# Patient Record
Sex: Female | Born: 1965
Health system: Southern US, Community
[De-identification: ages and names within clinical notes are randomized; demographics above are authoritative.]

## PROBLEM LIST (undated history)

## (undated) DIAGNOSIS — I10 Essential (primary) hypertension: Secondary | ICD-10-CM

## (undated) DIAGNOSIS — F329 Major depressive disorder, single episode, unspecified: Secondary | ICD-10-CM

## (undated) DIAGNOSIS — G8929 Other chronic pain: Secondary | ICD-10-CM

## (undated) DIAGNOSIS — M1710 Unilateral primary osteoarthritis, unspecified knee: Secondary | ICD-10-CM

## (undated) DIAGNOSIS — J302 Other seasonal allergic rhinitis: Secondary | ICD-10-CM

## (undated) DIAGNOSIS — K219 Gastro-esophageal reflux disease without esophagitis: Secondary | ICD-10-CM

## (undated) DIAGNOSIS — F32A Depression, unspecified: Secondary | ICD-10-CM

## (undated) DIAGNOSIS — Z8489 Family history of other specified conditions: Secondary | ICD-10-CM

## (undated) DIAGNOSIS — M545 Low back pain, unspecified: Secondary | ICD-10-CM

## (undated) DIAGNOSIS — M179 Osteoarthritis of knee, unspecified: Secondary | ICD-10-CM

## (undated) DIAGNOSIS — K449 Diaphragmatic hernia without obstruction or gangrene: Secondary | ICD-10-CM

## (undated) DIAGNOSIS — K5909 Other constipation: Secondary | ICD-10-CM

## (undated) DIAGNOSIS — F419 Anxiety disorder, unspecified: Secondary | ICD-10-CM

## (undated) HISTORY — PX: JOINT REPLACEMENT: SHX530

## (undated) HISTORY — PX: ESOPHAGOGASTRODUODENOSCOPY: SHX1529

## (undated) HISTORY — PX: COLONOSCOPY: SHX174

---

## 1987-11-30 HISTORY — PX: TUBAL LIGATION: SHX77

## 1994-11-29 HISTORY — PX: TOE SURGERY: SHX1073

## 2005-11-29 HISTORY — PX: LAPAROSCOPIC NISSEN FUNDOPLICATION: SHX1932

## 2006-01-06 ENCOUNTER — Ambulatory Visit: Payer: Self-pay | Admitting: Psychiatry

## 2011-02-11 ENCOUNTER — Ambulatory Visit: Payer: Self-pay | Admitting: Otolaryngology

## 2012-12-08 IMAGING — CT CT MAXILLOFACIAL WITHOUT CONTRAST
2 series · 13 of 40 positions shown, 16 images · non-contrast
Comparison: none

REASON FOR EXAM: chronic sinusitis cough
COMMENTS:

[Series 2: osteo (id) · axial · 0.39mm/px · z∈[+642,+768]mm · 10 of 98 slices shown, 13 images]
[im 7/98  brain]
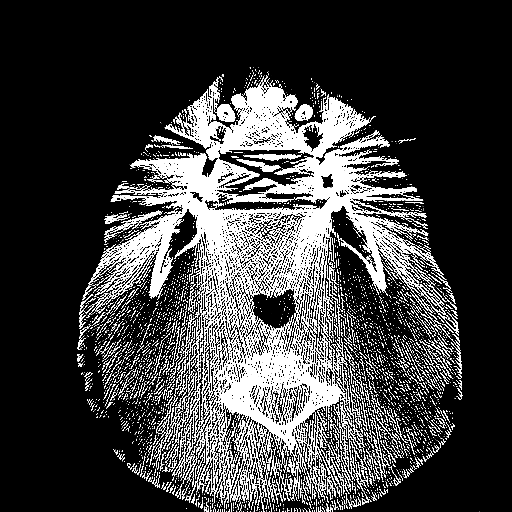
[im 7/98  bone]
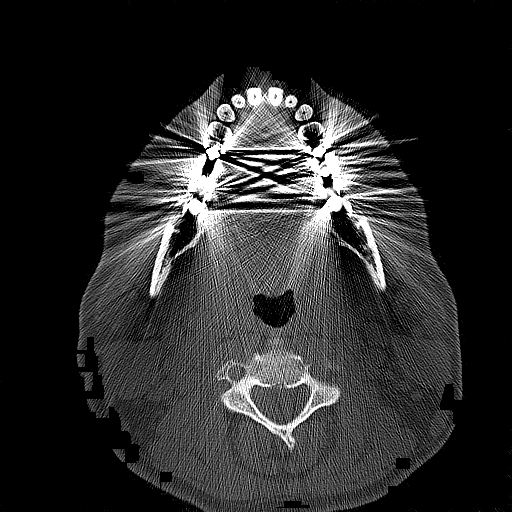
[im 17/98  bone]
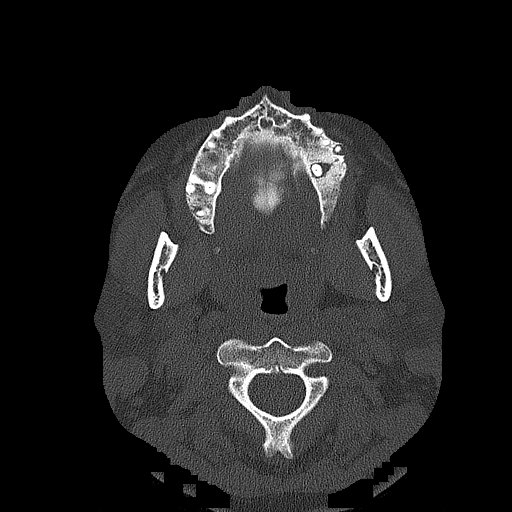
[im 27/98  bone]
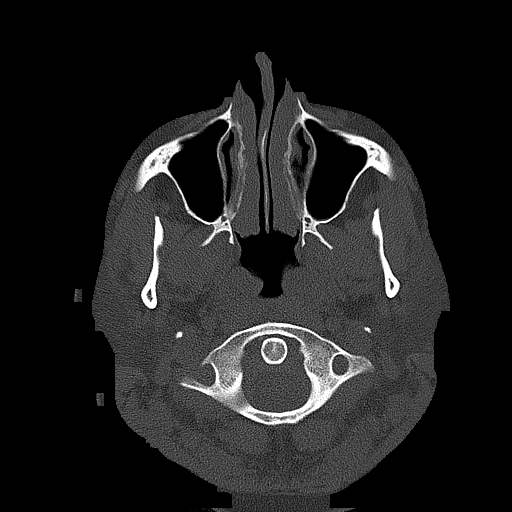
[im 34/98  bone]
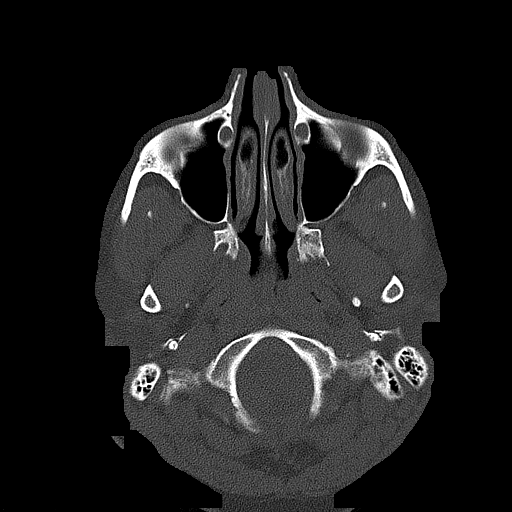
[im 44/98  brain]
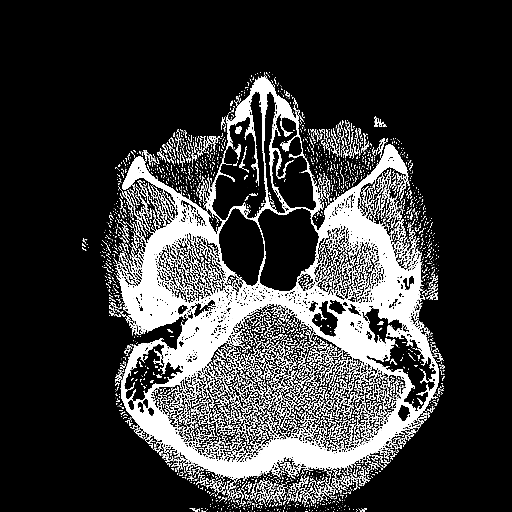
[im 44/98  bone]
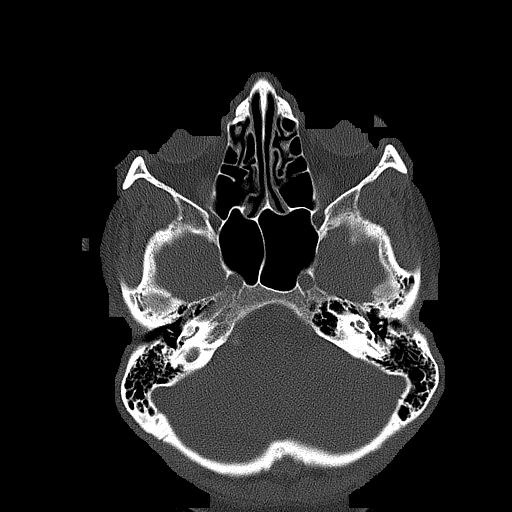
[im 54/98  bone]
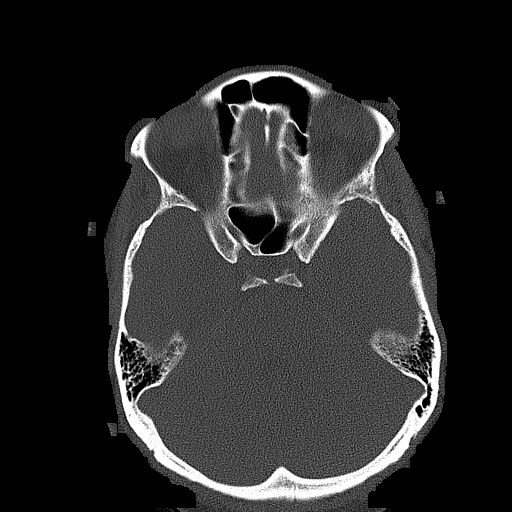
[im 64/98  bone]
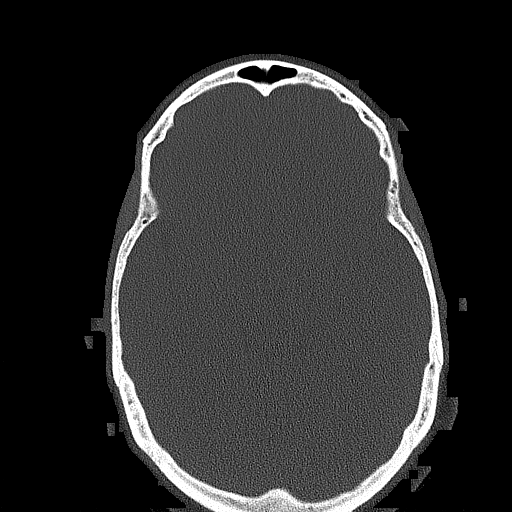
[im 74/98  bone]
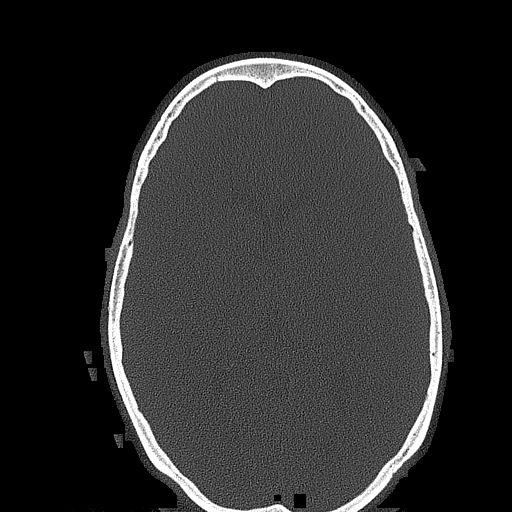
[im 81/98  brain]
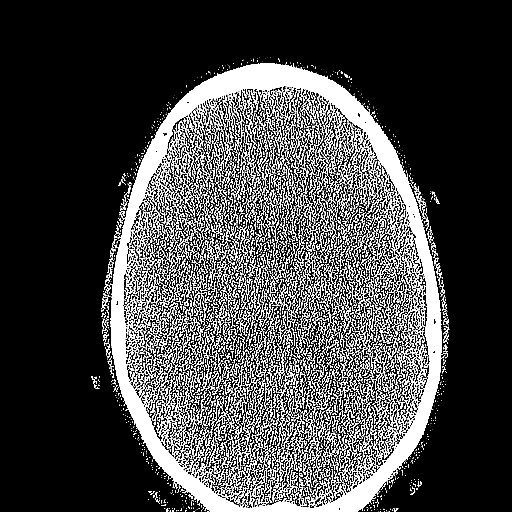
[im 81/98  bone]
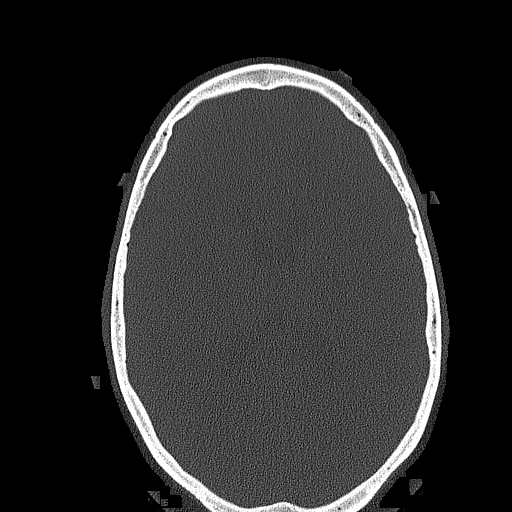
[im 91/98  bone]
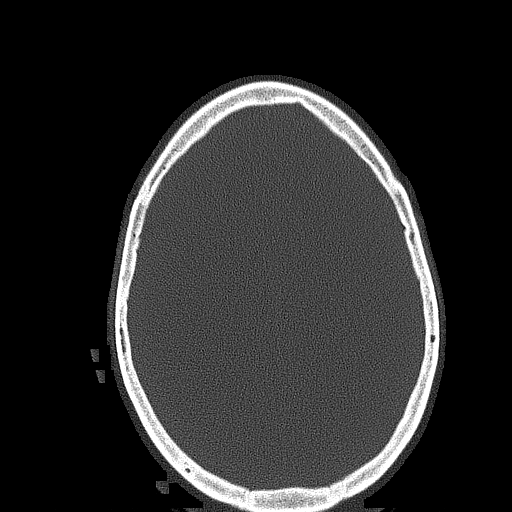

[Series 3: coronal · coronal · 0.29mm/px · 3 of 97 slices shown]
[im 33/97  bone]
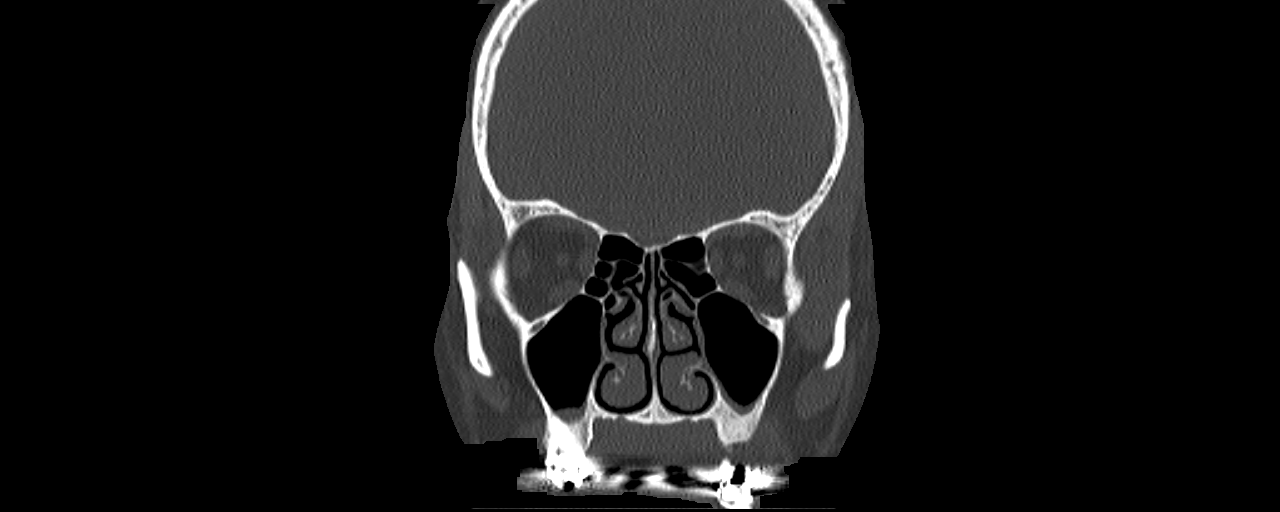
[im 43/97  bone]
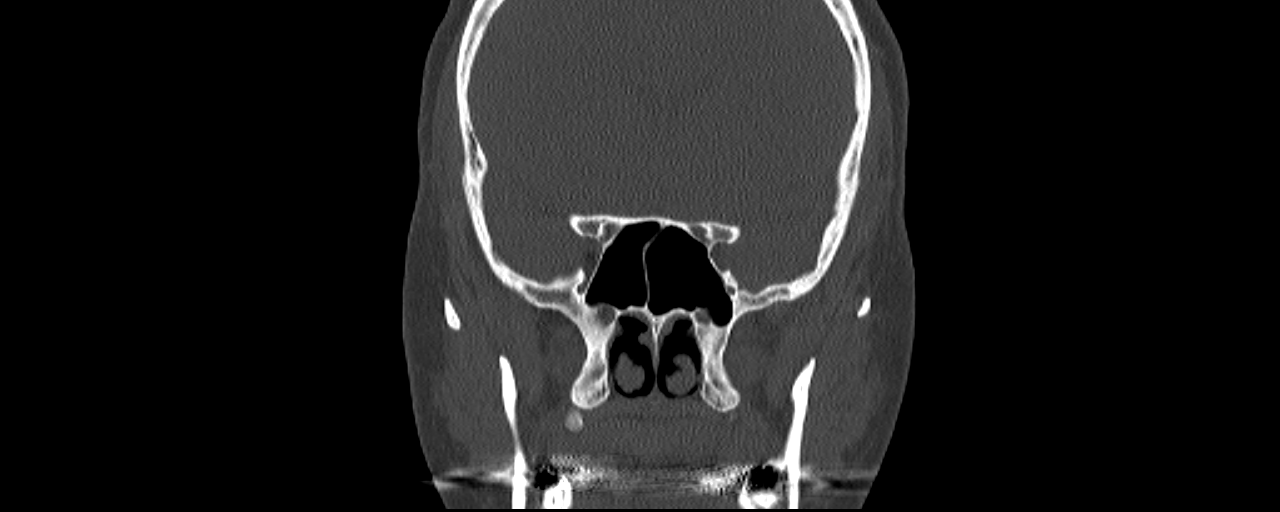
[im 54/97  bone]
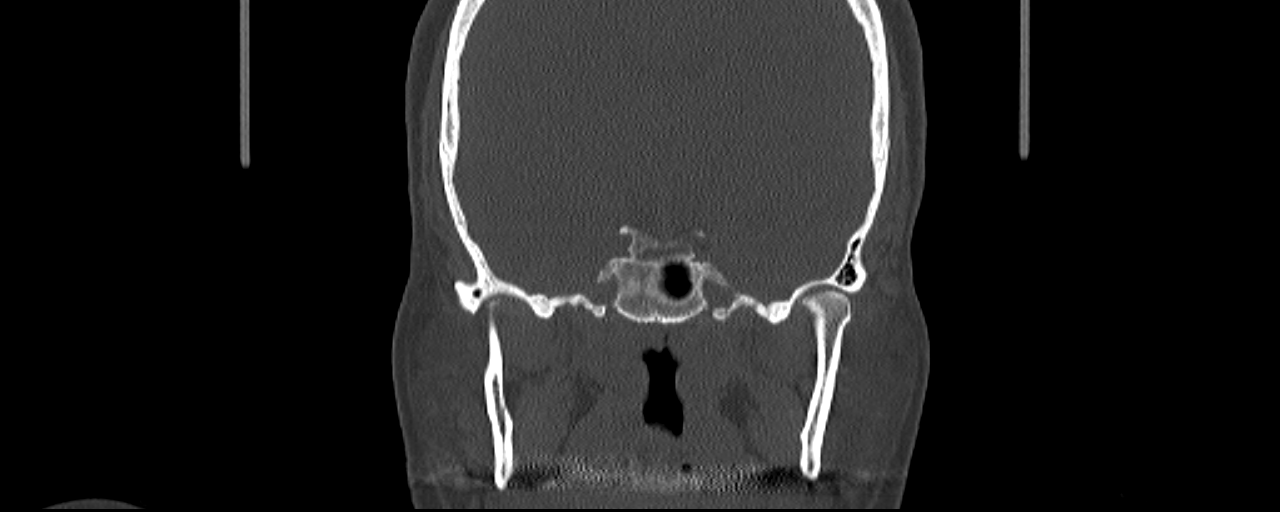

[13 of 40 positions shown; findings below may reference images not displayed]

PROCEDURE:     KCT - KCT MAXILLOFACIAL(CAVALLO)  - February 11, 2011  [DATE]

RESULT:     Maxillofacial CT is reconstructed at bone window settings in the
axial and coronal planes at 2 mm slice thickness. No previous examination is
available for comparison.

The nasal septum approximates midline. The ostiomeatal complexes are
normally formed and patent bilaterally. The sinuses appear clear without
evidence of significant mucosal thickening, mass or air-fluid level. Minimal
maxillary mucosal thickening is present. The mastoids demonstrate normal
aeration. The included portions of the calvarium and orbits appear to be
intact.
IMPRESSION: 1. No acute sinus disease.
2. Mucosal thickening in the maxillary sinuses.

## 2013-03-15 ENCOUNTER — Ambulatory Visit: Payer: Self-pay | Admitting: Gastroenterology

## 2013-04-05 ENCOUNTER — Ambulatory Visit: Payer: Self-pay | Admitting: Gastroenterology

## 2013-05-10 ENCOUNTER — Ambulatory Visit: Payer: Self-pay | Admitting: Gastroenterology

## 2013-06-04 ENCOUNTER — Other Ambulatory Visit: Payer: Self-pay | Admitting: Obstetrics and Gynecology

## 2013-06-12 ENCOUNTER — Encounter (HOSPITAL_COMMUNITY): Payer: Self-pay | Admitting: *Deleted

## 2013-06-14 NOTE — H&P (Signed)
Amanda Taylor, Amanda Taylor                ACCOUNT NO.:  [id number]  MEDICAL RECORD NO.:  [id number]  LOCATION:  [facility]                         FACILITY:  [facility]  PHYSICIAN:  Lenoard Aden, M.D.DATE OF BIRTH:  07-10-1966  DATE OF ADMISSION:  05/31/2013 DATE OF DISCHARGE:                             HISTORY & PHYSICAL   CHIEF COMPLAINT:  Menometrorrhagia.  HISTORY OF PRESENT ILLNESS:  She is a 47 year old African American female, G2, P2, history of tubal ligation, with refractory menorrhagia for definitive therapy.  MEDICAL PROBLEMS:  Include hypertension.  MEDICATIONS:  Dexilant, Zyrtec, losartan, and Mobic.  ALLERGIES:  Topamax and Elavil.  FAMILY HISTORY:  Diabetes, heart disease, and hypertension, history of vaginal delivery x2.  PHYSICAL EXAMINATION:  GENERAL:  She is a well-developed, well- nourished, African American female, in no acute distress. HEENT:  Normal. NECK:  Supple.  Full range of motion. LUNGS:  Clear. HEART:  Regular rhythm. ABDOMEN:  Soft, nontender. PELVIC:  Uterus to be bulky irregularly shaped, anteflexed with no adnexal masses. EXTREMITIES:  No cords. NEUROLOGIC:  Nonfocal. SKIN:  Intact.  IMPRESSION: 1. Refractory menometrorrhagia. 2. Hypertension.  PLAN:  To proceed with diagnostic hysteroscopy, D and C, NovaSure endometrial ablation.  Risks of anesthesia, infection, bleeding, injury to surrounding organs with possible need for repair is discussed.  The patient acknowledges and wishes to proceed.     Lenoard Aden, M.D.     RJT/MEDQ  D:  06/14/2013  T:  06/14/2013  Job:  [id number]

## 2013-06-15 ENCOUNTER — Encounter (HOSPITAL_COMMUNITY)
Admission: RE | Disposition: A | Discharge status: Home / Self Care | Payer: Self-pay | Source: Ambulatory Visit | Attending: Obstetrics and Gynecology

## 2013-06-15 ENCOUNTER — Ambulatory Visit (HOSPITAL_COMMUNITY): Payer: 59 | Admitting: Anesthesiology

## 2013-06-15 ENCOUNTER — Encounter (HOSPITAL_COMMUNITY): Payer: Self-pay | Admitting: *Deleted

## 2013-06-15 ENCOUNTER — Encounter (HOSPITAL_COMMUNITY): Payer: Self-pay | Admitting: Pharmacy Technician

## 2013-06-15 ENCOUNTER — Encounter (HOSPITAL_COMMUNITY): Payer: Self-pay | Admitting: Anesthesiology

## 2013-06-15 ENCOUNTER — Ambulatory Visit (HOSPITAL_COMMUNITY)
Admission: RE | Admit: 2013-06-15 | Discharge: 2013-06-15 | Disposition: A | Discharge status: Home / Self Care | Payer: 59 | Source: Ambulatory Visit | Attending: Obstetrics and Gynecology | Admitting: Obstetrics and Gynecology

## 2013-06-15 DIAGNOSIS — N840 Polyp of corpus uteri: Secondary | ICD-10-CM | POA: Insufficient documentation

## 2013-06-15 DIAGNOSIS — I10 Essential (primary) hypertension: Secondary | ICD-10-CM | POA: Insufficient documentation

## 2013-06-15 DIAGNOSIS — Z9851 Tubal ligation status: Secondary | ICD-10-CM | POA: Insufficient documentation

## 2013-06-15 DIAGNOSIS — N920 Excessive and frequent menstruation with regular cycle: Secondary | ICD-10-CM | POA: Insufficient documentation

## 2013-06-15 HISTORY — PX: DILITATION & CURRETTAGE/HYSTROSCOPY WITH NOVASURE ABLATION: SHX5568

## 2013-06-15 HISTORY — DX: Gastro-esophageal reflux disease without esophagitis: K21.9

## 2013-06-15 HISTORY — DX: Low back pain: M54.5

## 2013-06-15 HISTORY — DX: Essential (primary) hypertension: I10

## 2013-06-15 HISTORY — DX: Other seasonal allergic rhinitis: J30.2

## 2013-06-15 HISTORY — DX: Other chronic pain: G89.29

## 2013-06-15 HISTORY — DX: Low back pain, unspecified: M54.50

## 2013-06-15 LAB — CBC
Hemoglobin: 11.4 g/dL — ABNORMAL LOW (ref 12.0–15.0)
MCH: 26.8 pg (ref 26.0–34.0)
Platelets: 404 10*3/uL — ABNORMAL HIGH (ref 150–400)
RBC: 4.26 MIL/uL (ref 3.87–5.11)
WBC: 12 10*3/uL — ABNORMAL HIGH (ref 4.0–10.5)

## 2013-06-15 LAB — BASIC METABOLIC PANEL
CO2: 28 mEq/L (ref 19–32)
Chloride: 100 mEq/L (ref 96–112)
Creatinine, Ser: 1.27 mg/dL — ABNORMAL HIGH (ref 0.50–1.10)
GFR calc Af Amer: 57 mL/min — ABNORMAL LOW (ref >90)
Sodium: 136 mEq/L (ref 135–145)

## 2013-06-15 LAB — HCG, SERUM, QUALITATIVE: Preg, Serum: NEGATIVE

## 2013-06-15 SURGERY — DILATATION & CURETTAGE/HYSTEROSCOPY WITH NOVASURE ABLATION
Anesthesia: General | Site: Cervix | Wound class: Clean Contaminated

## 2013-06-15 MED ORDER — FENTANYL CITRATE 0.05 MG/ML IJ SOLN
INTRAMUSCULAR | Status: AC
  Administered 2013-06-15: 50 ug via INTRAVENOUS
  Filled 2013-06-15: qty 2

## 2013-06-15 MED ORDER — KETOROLAC TROMETHAMINE 30 MG/ML IJ SOLN
INTRAMUSCULAR | Status: DC | PRN
  Administered 2013-06-15: 30 mg via INTRAVENOUS

## 2013-06-15 MED ORDER — FENTANYL CITRATE 0.05 MG/ML IJ SOLN: 25.0000 ug | INTRAMUSCULAR | Status: DC | PRN

## 2013-06-15 MED ORDER — KETOROLAC TROMETHAMINE 30 MG/ML IJ SOLN: 15.0000 mg | Freq: Once | INTRAMUSCULAR | Status: DC | PRN

## 2013-06-15 MED ORDER — MEPERIDINE HCL 25 MG/ML IJ SOLN: 6.2500 mg | INTRAMUSCULAR | Status: DC | PRN

## 2013-06-15 MED ORDER — DEXAMETHASONE SODIUM PHOSPHATE 10 MG/ML IJ SOLN
INTRAMUSCULAR | Status: DC | PRN
  Administered 2013-06-15: 10 mg via INTRAVENOUS

## 2013-06-15 MED ORDER — BUPIVACAINE HCL (PF) 0.25 % IJ SOLN
INTRAMUSCULAR | Status: DC | PRN
  Administered 2013-06-15 (×2): 10 mL

## 2013-06-15 MED ORDER — OXYCODONE-ACETAMINOPHEN 5-325 MG PO TABS: 1.0000 | ORAL_TABLET | Freq: Once | ORAL | Status: AC

## 2013-06-15 MED ORDER — KETOROLAC TROMETHAMINE 30 MG/ML IJ SOLN
INTRAMUSCULAR | Status: AC
  Filled 2013-06-15: qty 1

## 2013-06-15 MED ORDER — ONDANSETRON HCL 4 MG/2ML IJ SOLN
INTRAMUSCULAR | Status: DC | PRN
  Administered 2013-06-15: 4 mg via INTRAVENOUS

## 2013-06-15 MED ORDER — BUPIVACAINE HCL (PF) 0.25 % IJ SOLN
INTRAMUSCULAR | Status: AC
  Filled 2013-06-15: qty 30

## 2013-06-15 MED ORDER — OXYCODONE-ACETAMINOPHEN 5-325 MG PO TABS
ORAL_TABLET | ORAL | Status: AC
  Administered 2013-06-15: 1 via ORAL
  Filled 2013-06-15: qty 1

## 2013-06-15 MED ORDER — ONDANSETRON HCL 4 MG/2ML IJ SOLN: 4.0000 mg | Freq: Once | INTRAMUSCULAR | Status: DC | PRN

## 2013-06-15 MED ORDER — OXYCODONE-ACETAMINOPHEN 5-325 MG PO TABS: 1.0000 | ORAL_TABLET | ORAL | Status: DC | PRN

## 2013-06-15 MED ORDER — METOCLOPRAMIDE HCL 5 MG/ML IJ SOLN
INTRAMUSCULAR | Status: AC
  Administered 2013-06-15: 10 mg via INTRAVENOUS
  Filled 2013-06-15: qty 2

## 2013-06-15 MED ORDER — MIDAZOLAM HCL 5 MG/5ML IJ SOLN
INTRAMUSCULAR | Status: DC | PRN
  Administered 2013-06-15: 2 mg via INTRAVENOUS

## 2013-06-15 MED ORDER — FENTANYL CITRATE 0.05 MG/ML IJ SOLN
INTRAMUSCULAR | Status: AC
  Filled 2013-06-15: qty 2

## 2013-06-15 MED ORDER — METOCLOPRAMIDE HCL 5 MG/ML IJ SOLN: 10.0000 mg | Freq: Once | INTRAMUSCULAR | Status: AC

## 2013-06-15 MED ORDER — MIDAZOLAM HCL 2 MG/2ML IJ SOLN
INTRAMUSCULAR | Status: AC
  Filled 2013-06-15: qty 2

## 2013-06-15 MED ORDER — DEXAMETHASONE SODIUM PHOSPHATE 10 MG/ML IJ SOLN
INTRAMUSCULAR | Status: AC
  Filled 2013-06-15: qty 1

## 2013-06-15 MED ORDER — LACTATED RINGERS IV SOLN
INTRAVENOUS | Status: DC
  Administered 2013-06-15: 50 mL/h via INTRAVENOUS

## 2013-06-15 MED ORDER — LIDOCAINE HCL (CARDIAC) 20 MG/ML IV SOLN
INTRAVENOUS | Status: DC | PRN
  Administered 2013-06-15: 100 mg via INTRAVENOUS

## 2013-06-15 MED ORDER — FENTANYL CITRATE 0.05 MG/ML IJ SOLN
INTRAMUSCULAR | Status: DC | PRN
  Administered 2013-06-15: 100 ug via INTRAVENOUS

## 2013-06-15 MED ORDER — LIDOCAINE HCL (CARDIAC) 20 MG/ML IV SOLN
INTRAVENOUS | Status: AC
  Filled 2013-06-15: qty 5

## 2013-06-15 MED ORDER — CEFAZOLIN SODIUM-DEXTROSE 2-3 GM-% IV SOLR: 2.0000 g | INTRAVENOUS | Status: DC

## 2013-06-15 MED ORDER — VASOPRESSIN 20 UNIT/ML IJ SOLN
INTRAMUSCULAR | Status: AC
  Filled 2013-06-15: qty 1

## 2013-06-15 MED ORDER — CEFAZOLIN SODIUM-DEXTROSE 2-3 GM-% IV SOLR
INTRAVENOUS | Status: AC
  Administered 2013-06-15: 2 g via INTRAVENOUS
  Filled 2013-06-15: qty 50

## 2013-06-15 MED ORDER — PROPOFOL 10 MG/ML IV EMUL
INTRAVENOUS | Status: AC
  Filled 2013-06-15: qty 20

## 2013-06-15 MED ORDER — LACTATED RINGERS IV SOLN
INTRAVENOUS | Status: DC | PRN
  Administered 2013-06-15: 10:00:00 via INTRAVENOUS

## 2013-06-15 MED ORDER — ONDANSETRON HCL 4 MG/2ML IJ SOLN
INTRAMUSCULAR | Status: AC
  Filled 2013-06-15: qty 2

## 2013-06-15 MED ORDER — PROPOFOL 10 MG/ML IV BOLUS
INTRAVENOUS | Status: DC | PRN
  Administered 2013-06-15: 200 mg via INTRAVENOUS

## 2013-06-15 SURGICAL SUPPLY — 13 items
ABLATOR ENDOMETRIAL BIPOLAR (ABLATOR) ×2 IMPLANT
CATH ROBINSON RED A/P 16FR (CATHETERS) ×2 IMPLANT
CLOTH BEACON ORANGE TIMEOUT ST (SAFETY) ×2 IMPLANT
CONTAINER PREFILL 10% NBF 60ML (FORM) ×4 IMPLANT
DRESSING TELFA 8X3 (GAUZE/BANDAGES/DRESSINGS) ×2 IMPLANT
GLOVE BIO SURGEON STRL SZ7.5 (GLOVE) ×2 IMPLANT
GOWN STRL REIN XL XLG (GOWN DISPOSABLE) ×4 IMPLANT
PACK HYSTEROSCOPY LF (CUSTOM PROCEDURE TRAY) ×2 IMPLANT
PAD OB MATERNITY 4.3X12.25 (PERSONAL CARE ITEMS) ×2 IMPLANT
PAD PREP 24X48 CUFFED NSTRL (MISCELLANEOUS) ×2 IMPLANT
SYR TB 1ML 25GX5/8 (SYRINGE) ×2 IMPLANT
TOWEL OR 17X24 6PK STRL BLUE (TOWEL DISPOSABLE) ×4 IMPLANT
WATER STERILE IRR 1000ML POUR (IV SOLUTION) ×2 IMPLANT

## 2013-06-15 NOTE — Anesthesia Procedure Notes (Signed)
Procedure Name: LMA Insertion Date/Time: 06/15/2013 9:54 AM Performed by: Masiah Woody, Jannet Askew Pre-anesthesia Checklist: Patient identified, Patient being monitored, Emergency Drugs available, Timeout performed and Suction available Patient Re-evaluated:Patient Re-evaluated prior to inductionOxygen Delivery Method: Circle system utilized Preoxygenation: Pre-oxygenation with 100% oxygen Intubation Type: IV induction Ventilation: Mask ventilation without difficulty LMA: LMA inserted LMA Size: 4.0 Number of attempts: 1 ETT to lip (cm): at lips. Dental Injury: Teeth and Oropharynx as per pre-operative assessment

## 2013-06-15 NOTE — Anesthesia Postprocedure Evaluation (Signed)
Anesthesia Post Note  Patient: Amanda Taylor  Procedure(s) Performed: Procedure(s) (LRB): DILATATION & CURETTAGE/HYSTEROSCOPY WITH NOVASURE ABLATION (N/A)  Anesthesia type: General  Patient location: PACU  Post pain: Pain level controlled  Post assessment: Post-op Vital signs reviewed  Last Vitals:  Filed Vitals:   06/15/13 1130  BP:   Pulse:   Temp: 36.5 C  Resp: 18    Post vital signs: Reviewed  Level of consciousness: sedated  Complications: No apparent anesthesia complications

## 2013-06-15 NOTE — Op Note (Signed)
06/15/2013  10:18 AM  PATIENT:  Etta Grandchild  47 y.o. female  PRE-OPERATIVE DIAGNOSIS:  Dysfunctional Uterine Bleeding   Endometrial polyp  POST-OPERATIVE DIAGNOSIS:  Dysfunctional Uterine Bleeding    PROCEDURE:  Procedure(s): DILATATION & CURETTAGE/HYSTEROSCOPY WITH NOVASURE ABLATION Removal of endometrial polyp  SURGEON:  Surgeon(s): Lenoard Aden, MD  ASSISTANTS: none   ANESTHESIA:   local and general  ESTIMATED BLOOD LOSS: * No blood loss amount entered *   DRAINS: none   LOCAL MEDICATIONS USED:  MARCAINE     SPECIMEN:  Source of Specimen:  EMC with polyp  DISPOSITION OF SPECIMEN:  PATHOLOGY  COUNTS:  YES  DICTATION #: [id number]  PLAN OF CARE: dc home  PATIENT DISPOSITION:  PACU - hemodynamically stable.

## 2013-06-15 NOTE — Anesthesia Preprocedure Evaluation (Signed)
Anesthesia Evaluation  Patient identified by MRN, date of birth, ID band Patient awake    Reviewed: Allergy & Precautions, H&P , NPO status , Patient's Chart, lab work & pertinent test results  Airway Mallampati: I TM Distance: >3 FB Neck ROM: full    Dental no notable dental hx. (+) Teeth Intact   Pulmonary neg pulmonary ROS,    Pulmonary exam normal       Cardiovascular hypertension, Pt. on medications     Neuro/Psych negative neurological ROS  negative psych ROS   GI/Hepatic Neg liver ROS, GERD-  ,  Endo/Other  Morbid obesity  Renal/GU negative Renal ROS  negative genitourinary   Musculoskeletal negative musculoskeletal ROS (+)   Abdominal (+) + obese,   Peds negative pediatric ROS (+)  Hematology negative hematology ROS (+)   Anesthesia Other Findings   Reproductive/Obstetrics negative OB ROS                           Anesthesia Physical Anesthesia Plan  ASA: III  Anesthesia Plan: General   Post-op Pain Management:    Induction: Intravenous  Airway Management Planned: LMA  Additional Equipment:   Intra-op Plan:   Post-operative Plan:   Informed Consent: I have reviewed the patients History and Physical, chart, labs and discussed the procedure including the risks, benefits and alternatives for the proposed anesthesia with the patient or authorized representative who has indicated his/her understanding and acceptance.     Plan Discussed with: CRNA and Surgeon  Anesthesia Plan Comments:         Anesthesia Quick Evaluation

## 2013-06-15 NOTE — Progress Notes (Signed)
Patient ID: Amanda Taylor, female   DOB: 09/14/1966, 47 y.o.   MRN: [id number] Patient seen and examined. Consent witnessed and signed. No changes noted. Update completed.

## 2013-06-15 NOTE — Op Note (Signed)
Amanda Taylor, Amanda Taylor                ACCOUNT NO.:  [id number]  MEDICAL RECORD NO.:  [id number]  LOCATION:  [facility]                          FACILITY:  [facility]  PHYSICIAN:  Lenoard Aden, M.D.DATE OF BIRTH:  [birth year]  DATE OF PROCEDURE:  06/15/2013 DATE OF DISCHARGE:  06/15/2013                              OPERATIVE REPORT   DESCRIPTION OF PROCEDURE:  After being apprised of risks of anesthesia, infection, bleeding, injury to surrounding organs, possible need for repair, delayed versus immediate complications include bowel and bladder injury, possible need for repair.  The patient was brought to the operating room where she was administered general anesthetic without complications.  Prepped and draped in usual sterile fashion.  Feet were placed in Yellofin stirrups.  Exam under anesthesia reveals a bulky irregular anteflexed uterus and no adnexal masses appreciated.  Speculum placed, dilute Marcaine solution placed 20 mL total standard paracervical block.  After placement of the paracervical block, the cervix was easily dilated up to 25 Pratt dilator.  Hysteroscope was placed.  Visualization reveals a small posterior lateral endometrial polyp which was resected using endometrial polyp forceps without difficulty and sent with endometrial curettage specimen which was collected using sharp curettage in a four-quadrant method.  Bilateral normal tubal ostia noted.  At this time, the NovaSure device was entered seated to a cavity length of 6.5, a cavity width of 3.8.  CO2 test was performed and was negative.  The procedure was initiated for 42 seconds to a power of 136 watts without complications.  Device was removed, inspected, and found to be intact.  Revisualization of endometrial cavity reveals well ablated endometrial cavity with no evidence of intracavitary defect.  No evidence of perforation fluid deficit 55 mL. Blood loss minimal.  The patient tolerated the procedure well,  was awakened, and transferred to recovery in good condition.     Lenoard Aden, M.D.     RJT/MEDQ  D:  06/15/2013  T:  06/15/2013  Job:  [id number]

## 2013-06-15 NOTE — Transfer of Care (Signed)
Immediate Anesthesia Transfer of Care Note  Patient: Amanda Taylor  Procedure(s) Performed: Procedure(s): DILATATION & CURETTAGE/HYSTEROSCOPY WITH NOVASURE ABLATION (N/A)  Patient Location: PACU  Anesthesia Type:General  Level of Consciousness: awake, alert  and oriented  Airway & Oxygen Therapy: Patient Spontanous Breathing and Patient connected to nasal cannula oxygen  Post-op Assessment: Report given to PACU RN and Post -op Vital signs reviewed and stable  Post vital signs: Reviewed and stable  Complications: No apparent anesthesia complications

## 2013-06-18 ENCOUNTER — Encounter (HOSPITAL_COMMUNITY): Payer: Self-pay | Admitting: Obstetrics and Gynecology

## 2015-01-10 IMAGING — US ABDOMEN ULTRASOUND LIMITED
1 series · 14 of 25 positions shown · non-contrast
Comparison: none

REASON FOR EXAM: Abdominal pain epigastric
COMMENTS:

[Series 1: abdomen ultrasound limited · 0.33mm/px · 14 of 106 slices shown]
[im 1/106]
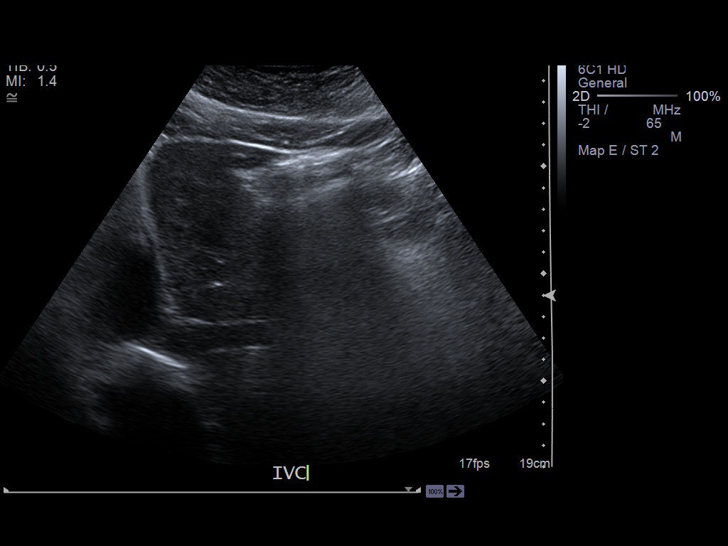
[im 9/106]
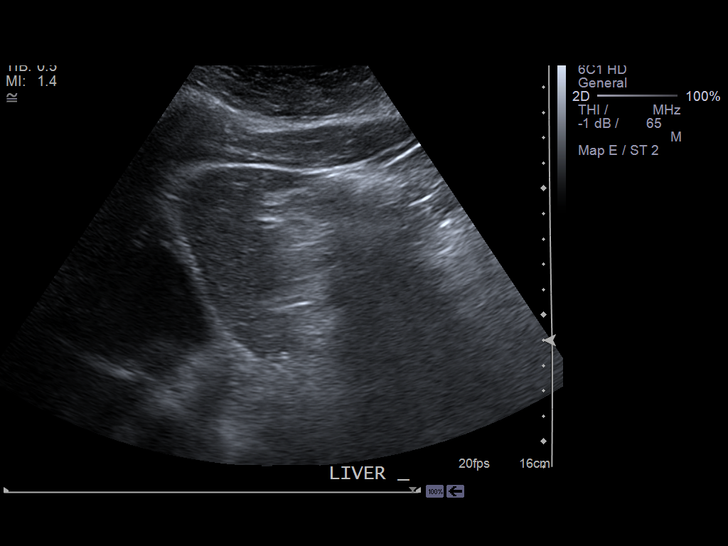
[im 18/106]
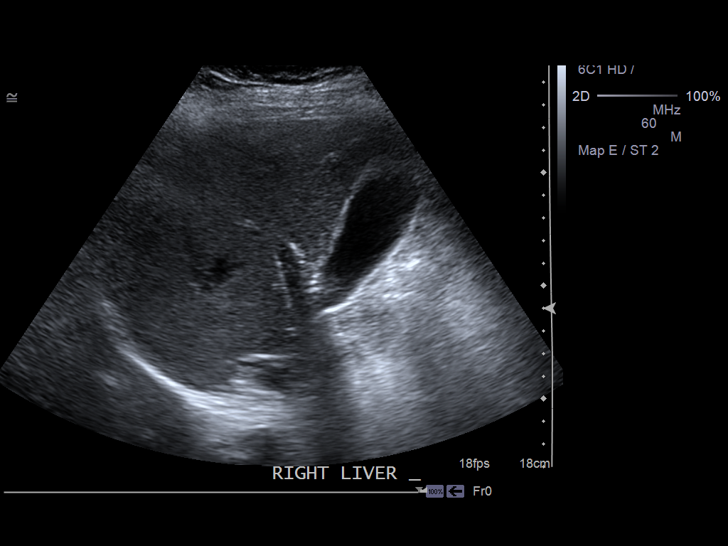
[im 27/106]
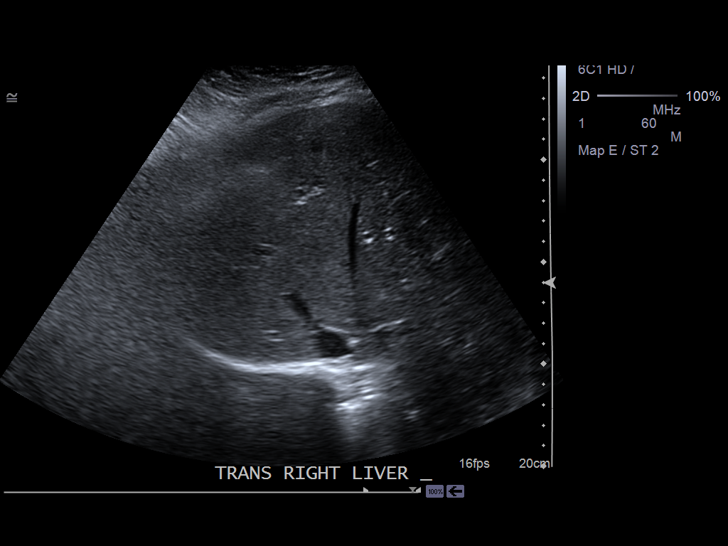
[im 36/106]
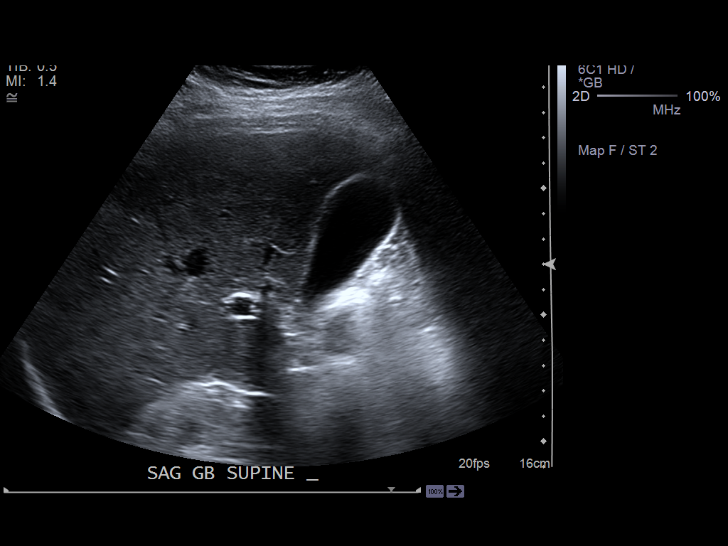
[im 40/106]
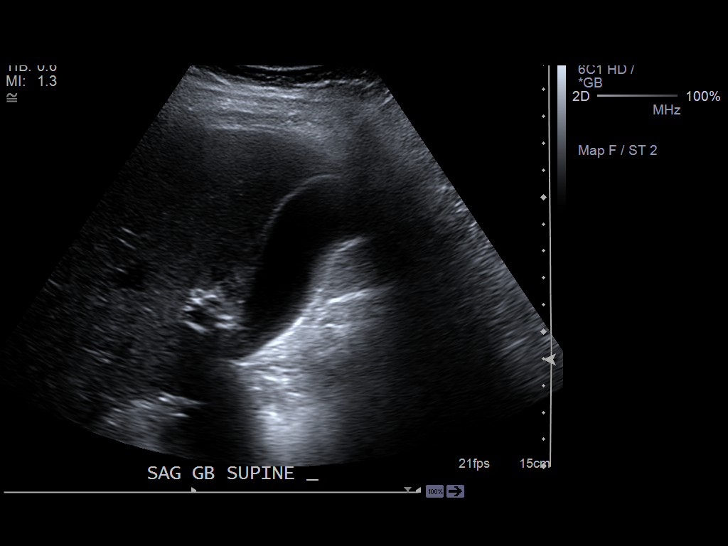
[im 49/106]
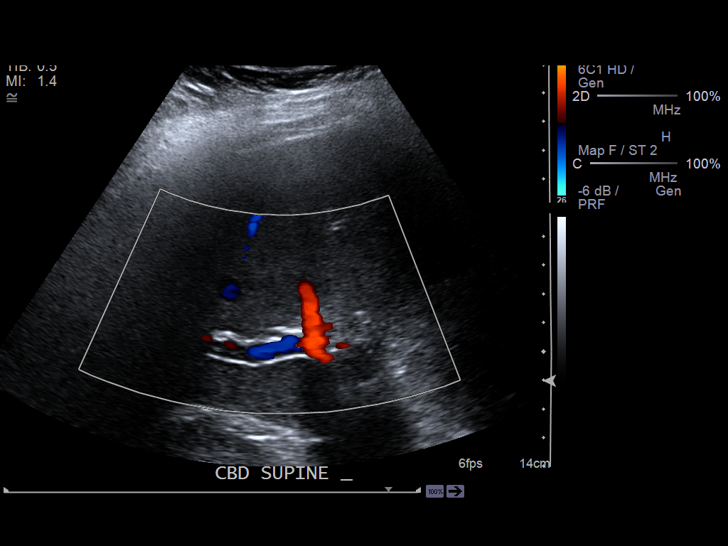
[im 57/106]
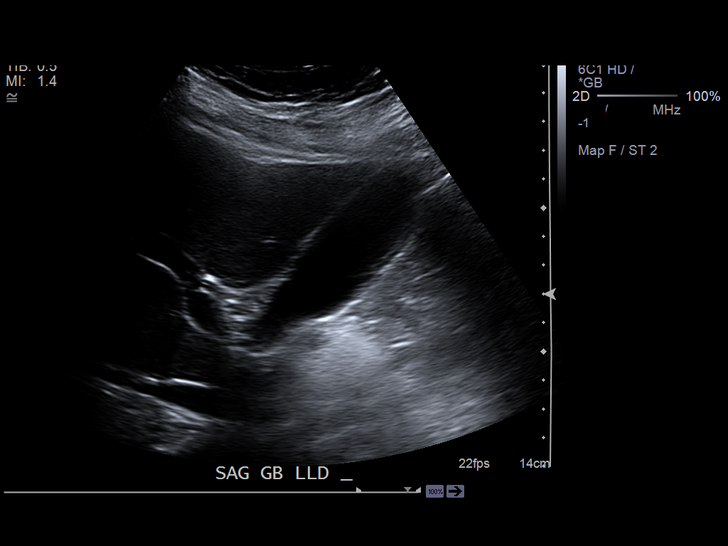
[im 66/106]
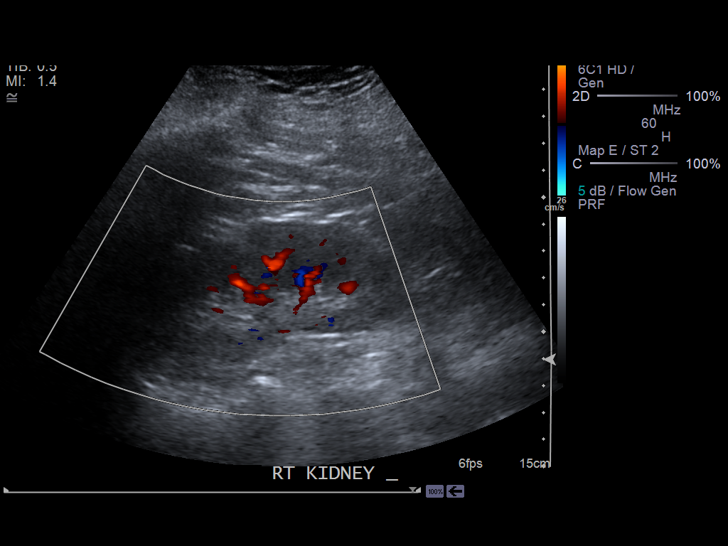
[im 71/106]
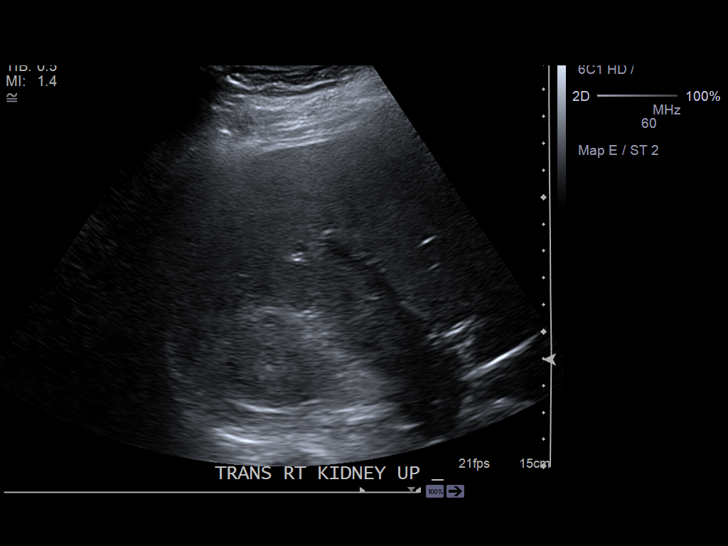
[im 79/106]
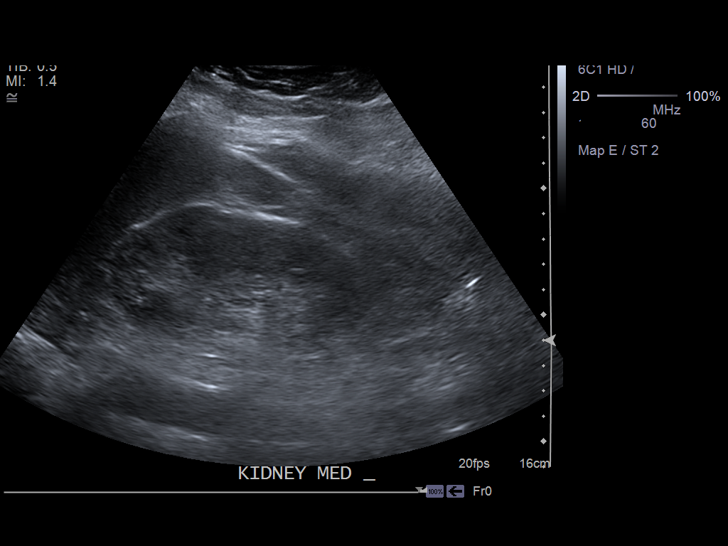
[im 88/106]
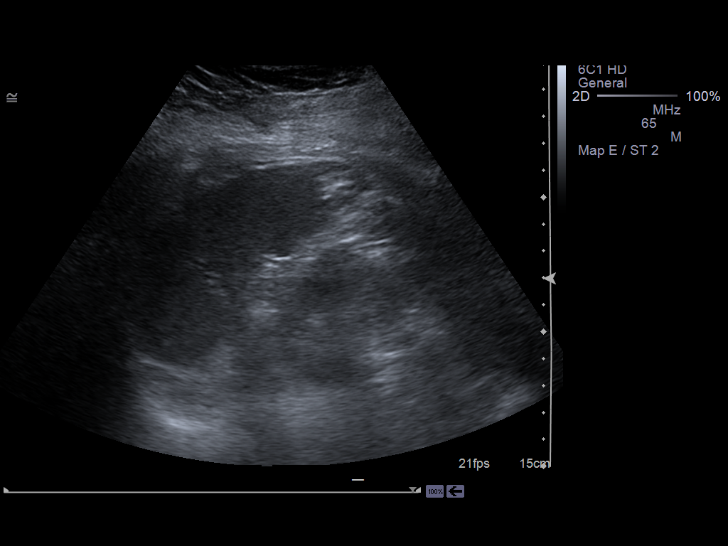
[im 97/106]
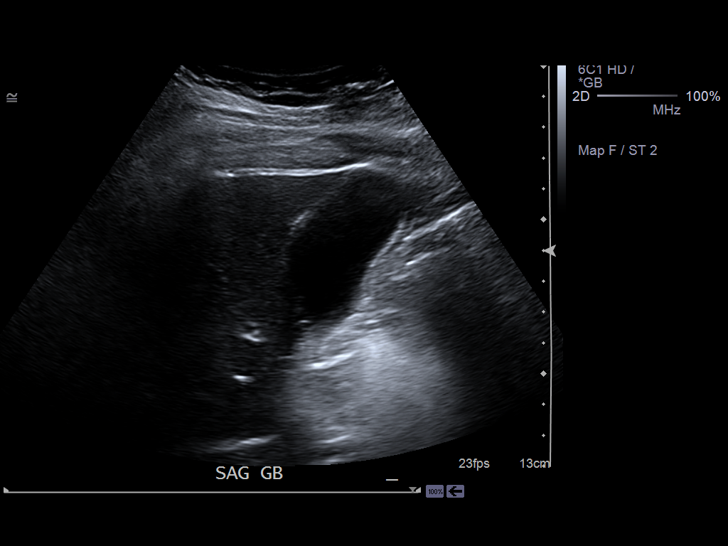
[im 106/106]
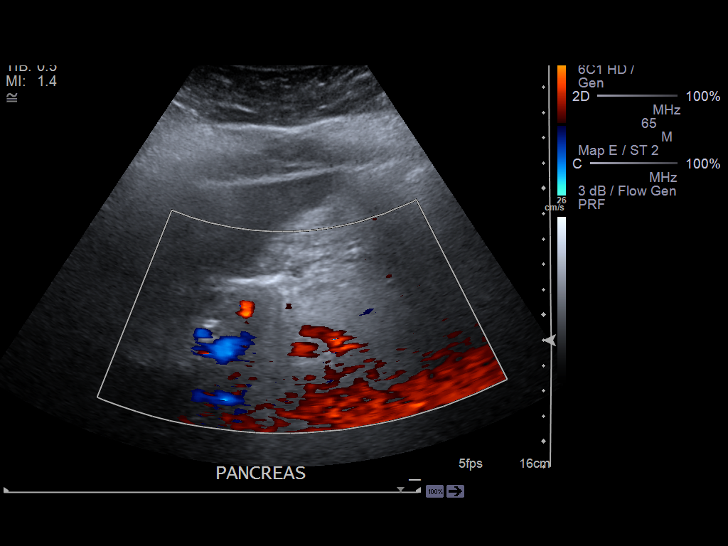

[14 of 25 positions shown; findings below may reference images not displayed]

PROCEDURE:     BOO - BOO ABDOMEN UPPER GENERAL  - March 15, 2013  [DATE]

RESULT:     Abdominal sonogram shows the pancreas is predominantly skewered
by overlying bowel gas as is the proximal abdominal aorta. The mid to distal
aorta is visualized and appears unremarkable. The proximal inferior vena
cava appears within normal limits. The liver length is 17.6 cm in the mid
axillary line area portal venous flow is normal. The hepatic echotexture is
normal. There are no gallstones. The gallbladder wall thickness is 2.0 mm.
There is a negative sonographic Murphy's sign. The portal venous flow is
normal. Common bile duct diameter is 3.2 to 4.0 mm. The right kidney
measures 10.93 x 5.19 x 4.86 cm. The left kidney measures 10.28 x 4.69 x
4.77 cm. No stones or solid or cystic masses are seen. The spleen has a
length of 9.07 cm with a normal echotexture.
IMPRESSION: Limited visualization of the aorta, pancreas and inferior
vena cava. No acute abnormality evident.

[REDACTED]

## 2017-07-13 ENCOUNTER — Ambulatory Visit (INDEPENDENT_AMBULATORY_CARE_PROVIDER_SITE_OTHER): Payer: 59 | Admitting: Obstetrics and Gynecology

## 2017-07-13 ENCOUNTER — Encounter: Payer: Self-pay | Admitting: Obstetrics and Gynecology

## 2017-07-13 VITALS — BP 122/80 | HR 93 | Ht 65.0 in | Wt 276.0 lb

## 2017-07-13 DIAGNOSIS — Z131 Encounter for screening for diabetes mellitus: Secondary | ICD-10-CM | POA: Diagnosis not present

## 2017-07-13 DIAGNOSIS — E669 Obesity, unspecified: Secondary | ICD-10-CM

## 2017-07-13 DIAGNOSIS — Z6841 Body Mass Index (BMI) 40.0 and over, adult: Secondary | ICD-10-CM | POA: Diagnosis not present

## 2017-07-13 DIAGNOSIS — Z1329 Encounter for screening for other suspected endocrine disorder: Secondary | ICD-10-CM | POA: Diagnosis not present

## 2017-07-13 DIAGNOSIS — IMO0001 Reserved for inherently not codable concepts without codable children: Secondary | ICD-10-CM

## 2017-07-13 MED ORDER — PHENTERMINE HCL 37.5 MG PO TABS: 37.5000 mg | ORAL_TABLET | Freq: Every day | ORAL | 0 refills | Status: DC

## 2017-07-13 NOTE — Progress Notes (Signed)
Gynecology Office Visit  Chief Complaint:  Chief Complaint  Patient presents with  . Weight Loss    discuss weight loss    History of Present Illness: Patientis a [name], who presents for the evaluation of weight gain. She has gained had an inability to loose weight. The patient states the following issues have contributed to her weight problem: inactivity.  The patient has chronic lumbago. The patient specifically denies memory loss, muscle weakness, excessive thirst, and polyuria. Weight related co-morbidities include Hypertension.  She has tried phentermine in the past with moderate success.   No menstural issues s/p ablation  Review of Systems: 10 point review of systems negative unless otherwise noted in HPI  Past Medical History:  Past Medical History:  Diagnosis Date  . Chronic low back pain    mobic  . GERD (gastroesophageal reflux disease)    on dexiant  . Hypertension    on losartan-will take dos  . Seasonal allergies     Past Surgical History:  Past Surgical History:  Procedure Laterality Date  . COLONOSCOPY    . DILITATION & CURRETTAGE/HYSTROSCOPY WITH NOVASURE ABLATION N/A 06/15/2013   Procedure: DILATATION & CURETTAGE/HYSTEROSCOPY WITH NOVASURE ABLATION;  Surgeon: Lenoard Aden, MD;  Location: [facility];  Service: Gynecology;  Laterality: N/A;  . ESOPHAGOGASTRODUODENOSCOPY    . TOE SURGERY    . TUBAL LIGATION      Gynecologic History: No LMP recorded. Patient has had an ablation.  Obstetric History: G2P2  Family History:  History reviewed. No pertinent family history.  Social History:  Social History   Social History  . Marital status: Divorced    Spouse name: N/A  . Number of children: N/A  . Years of education: N/A   Occupational History  . Not on file.   Social History Main Topics  . Smoking status: Never Smoker  . Smokeless tobacco: Never Used  . Alcohol use Yes  . Drug use: No  . Sexual activity: Yes    Partners:  Male    Birth control/ protection: Surgical     Comment: Ablation   Other Topics Concern  . Not on file   Social History Narrative  . No narrative on file    Allergies:  Allergies  Allergen Reactions  . Elavil [Amitriptyline] Other (See Comments)    Urine retention  . Topamax [Topiramate] Other (See Comments)    Numbness in fingertips    Medications: Prior to Admission medications   Medication Sig Start Date End Date Taking? Authorizing Provider  ALPRAZolam Prudy Feeler) 1 MG tablet Take 1 mg by mouth 2 (two) times daily. 06/16/17  Yes [provider]  cyclobenzaprine (FLEXERIL) 10 MG tablet  06/10/17  Yes [provider]  esomeprazole (NEXIUM) 40 MG capsule  05/31/17  Yes [provider]  FLUoxetine (PROZAC) 40 MG capsule  05/31/17  Yes [provider]  hydrochlorothiazide (HYDRODIURIL) 25 MG tablet  05/31/17  Yes [provider]  losartan (COZAAR) 100 MG tablet  05/31/17  Yes [provider]  losartan-hydrochlorothiazide (HYZAAR) 100-25 MG per tablet Take 1 tablet by mouth daily.   Yes [provider]  meloxicam (MOBIC) 15 MG tablet Take 15 mg by mouth daily.   Yes [provider]  oxyCODONE-acetaminophen (PERCOCET) 10-325 MG tablet TAKE ONE TABLET BY MOUTH FOUR TIMES DAILY AS NEEDED FOR PAIN 06/16/17  Yes [provider]  promethazine (PHENERGAN) 25 MG tablet  06/16/17  Yes [provider]  ALPRAZolam (  XANAX) 0.25 MG tablet Take 1 mg by mouth as needed for sleep.    [provider]  cetirizine (ZYRTEC) 10 MG tablet Take 10 mg by mouth daily.    [provider]  Dexlansoprazole 30 MG capsule Take 30 mg by mouth at bedtime.    [provider]  oxyCODONE-acetaminophen (ROXICET) 5-325 MG per tablet Take 1-2 tablets by mouth every 4 (four) hours as needed for pain. Patient not taking: Reported on 07/13/2017 06/15/13   Olivia Mackieaavon, Richard, MD    Physical Exam Vitals:  Vitals:    07/13/17 1001  BP: 122/80  Pulse: 93   Filed Weights   07/13/17 1001  Weight: 276 lb (125.2 kg)   Body mass index is 45.93 kg/m.  No LMP recorded. Patient has had an ablation.  General: NAD HEENT: normocephalic, anicteric Thyroid: no enlargement Pulmonary: no increased work of breathing Neurologic: Grossly intact Psychiatric: mood appropriate, affect full  Assessment: 51 y.o. G2P2 presenting for discussion of weight loss management options  Plan: Problem List Items Addressed This Visit    None    Visit Diagnoses    Class 3 obesity without serious comorbidity with body mass index (BMI) of 40.0 to 44.9 in adult, unspecified obesity type (HCC)    -  Primary   Relevant Medications   phentermine (ADIPEX-P) 37.5 MG tablet   Other Relevant Orders   TSH   Hemoglobin A1c   Thyroid disorder screening       Relevant Orders   TSH   Screening for diabetes mellitus       Relevant Orders   Hemoglobin A1c      1) 1500 Calorie ADA Diet  2) Patient education given regarding appropriate lifestyle changes for weight loss including: regular physical activity, healthy coping strategies, caloric restriction and healthy eating patterns.  3) Patient will be started on weight loss medication. The risks and benefits and side effects of medication, such as Adipex (Phenteramine) ,  Tenuate (Diethylproprion), Belviq (lorcarsin), Contrave (buproprion/naltrexone), Qsymia (phentermine/topiramate), and Saxenda (liraglutide) is discussed. The pros and cons of suppressing appetite and boosting metabolism is discussed. Risks of tolerence and addiction is discussed for selected agents discussed. Use of medicine will ne short term, such as 3-4 months at a time followed by a period of time off of the medicine to avoid these risks and side effects for Adipex, Qsymia, and Tenuate discussed. Pt to call with any negative side effects and agrees to keep follow up appts. - has done well in past with no aggrevation  in blood pressure  4) Comorbidity Screening - hypothyroidism screening, diabetes, and hyperlipidemia screening offered  5) Encouraged weekly weight monitorig to track progress and sample 1 week food diary  6) Contraception - discussed that all weight loss drugs fall in to pregnancy category X, patient currently has reliable contraception in the form of s/p ablation and tubal ligation  7) 15 minutes face-to-face; counseling/coordination of care > 50 percent of visit  8) Follow up in 4 weeks to assess response

## 2017-07-23 LAB — HEMOGLOBIN A1C
ESTIMATED AVERAGE GLUCOSE: 108 mg/dL
Hgb A1c MFr Bld: 5.4 % (ref 4.8–5.6)

## 2017-07-23 LAB — TSH: TSH: 0.426 u[IU]/mL — ABNORMAL LOW (ref 0.450–4.500)

## 2017-08-09 ENCOUNTER — Encounter: Payer: Self-pay | Admitting: Obstetrics and Gynecology

## 2017-08-09 ENCOUNTER — Ambulatory Visit (INDEPENDENT_AMBULATORY_CARE_PROVIDER_SITE_OTHER): Payer: 59 | Admitting: Obstetrics and Gynecology

## 2017-08-09 VITALS — BP 124/60 | Ht 65.0 in | Wt 265.0 lb

## 2017-08-09 DIAGNOSIS — Z6841 Body Mass Index (BMI) 40.0 and over, adult: Secondary | ICD-10-CM

## 2017-08-09 DIAGNOSIS — E669 Obesity, unspecified: Secondary | ICD-10-CM | POA: Diagnosis not present

## 2017-08-09 DIAGNOSIS — IMO0001 Reserved for inherently not codable concepts without codable children: Secondary | ICD-10-CM

## 2017-08-09 MED ORDER — PHENTERMINE HCL 37.5 MG PO TABS: 37.5000 mg | ORAL_TABLET | Freq: Every day | ORAL | 0 refills | Status: DC

## 2017-08-12 NOTE — Progress Notes (Signed)
Gynecology Office Visit  Chief Complaint:  Chief Complaint  Patient presents with  . Weight Check    History of Present Illness: Patientis a [name], who presents for the evaluation of the desire to lose weight. She has lost 11 pounds. The patient states the following symptoms since starting her weight loss therapy: appetite suppression, energy, and weight loss.  The patient also reports no other ill effects. The patient specifically denies heart palpitations, anxiety, and insomnia.    Review of Systems: 10 point review of systems negative unless otherwise noted in HPI  Past Medical History:  Past Medical History:  Diagnosis Date  . Chronic low back pain    mobic  . GERD (gastroesophageal reflux disease)    on dexiant  . Hypertension    on losartan-will take dos  . Seasonal allergies     Past Surgical History:  Past Surgical History:  Procedure Laterality Date  . COLONOSCOPY    . DILITATION & CURRETTAGE/HYSTROSCOPY WITH NOVASURE ABLATION N/A 06/15/2013   Procedure: DILATATION & CURETTAGE/HYSTEROSCOPY WITH NOVASURE ABLATION;  Surgeon: Lenoard Aden, MD;  Location: [facility];  Service: Gynecology;  Laterality: N/A;  . ESOPHAGOGASTRODUODENOSCOPY    . TOE SURGERY    . TUBAL LIGATION      Gynecologic History: No LMP recorded. Patient has had an ablation.  Obstetric History: G2P2  Family History:  No family history on file.  Social History:  Social History   Social History  . Marital status: Divorced    Spouse name: N/A  . Number of children: N/A  . Years of education: N/A   Occupational History  . Not on file.   Social History Main Topics  . Smoking status: Never Smoker  . Smokeless tobacco: Never Used  . Alcohol use Yes  . Drug use: No  . Sexual activity: Yes    Partners: Male    Birth control/ protection: Surgical     Comment: Ablation   Other Topics Concern  . Not on file   Social History Narrative  . No narrative on file     Allergies:  Allergies  Allergen Reactions  . Elavil [Amitriptyline] Other (See Comments)    Urine retention  . Topamax [Topiramate] Other (See Comments)    Numbness in fingertips    Medications: Prior to Admission medications   Medication Sig Start Date End Date Taking? Authorizing Provider  ALPRAZolam Prudy Feeler) 1 MG tablet Take 1 mg by mouth 2 (two) times daily. 06/16/17  Yes [provider]  cyclobenzaprine (FLEXERIL) 10 MG tablet  06/10/17  Yes [provider]  esomeprazole (NEXIUM) 40 MG capsule  05/31/17  Yes [provider]  FLUoxetine (PROZAC) 40 MG capsule  05/31/17  Yes [provider]  hydrochlorothiazide (HYDRODIURIL) 25 MG tablet  05/31/17  Yes [provider]  losartan (COZAAR) 100 MG tablet  05/31/17  Yes [provider]  meloxicam (MOBIC) 15 MG tablet Take 15 mg by mouth daily.   Yes [provider]  oxyCODONE-acetaminophen (PERCOCET) 10-325 MG tablet TAKE ONE TABLET BY MOUTH FOUR TIMES DAILY AS NEEDED FOR PAIN 06/16/17  Yes [provider]  phentermine (ADIPEX-P) 37.5 MG tablet Take 1 tablet (37.5 mg total) by mouth daily before breakfast. 08/09/17  Yes Vena Austria, MD  promethazine (PHENERGAN) 25 MG tablet  06/16/17  Yes [provider]    Physical Exam Blood pressure 124/60, height  (1.651 m), weight 265 lb (120.2 kg). Body mass index is  44.1 kg/m.   General: NAD HEENT: normocephalic, anicteric Thyroid: no enlargement Pulmonary: no increased work of breathing Neurologic: Grossly intact Psychiatric: mood appropriate, affect full  Assessment: 51 y.o. G2P2 No problem-specific Assessment & Plan notes found for this encounter.   Plan: Problem List Items Addressed This Visit    None    Visit Diagnoses    Class 3 obesity without serious comorbidity with body mass index (BMI) of 40.0 to 44.9 in adult, unspecified obesity type (HCC)    -  Primary   Relevant Medications    phentermine (ADIPEX-P) 37.5 MG tablet      1) 1500 Calorie ADA Diet  2) Patient education given regarding appropriate lifestyle changes for weight loss including: regular physical activity, healthy coping strategies, caloric restriction and healthy eating patterns.  3) Patient will be started on weight loss medication. The risks and benefits and side effects of medication, such as Adipex (Phenteramine) ,  Tenuate (Diethylproprion), Belviq (lorcarsin), Contrave (buproprion/naltrexone), Qsymia (phentermine/topiramate), and Saxenda (liraglutide) is discussed. The pros and cons of suppressing appetite and boosting metabolism is discussed. Risks of tolerence and addiction is discussed for selected agents discussed. Use of medicine will ne short term, such as 3-4 months at a time followed by a period of time off of the medicine to avoid these risks and side effects for Adipex, Qsymia, and Tenuate discussed. Pt to call with any negative side effects and agrees to keep follow up appts.  4) Patient to take medication, with the benefits of appetite suppression and metabolism boost d/w pt, along with the side effects and risk factors of long term use that will be avoided with our use of short bursts of therapy. Rx provided.    5) 15 minutes face-to-face; with counseling/coordination of care > 50 percent of visit related to obesity and ongoing management/treatment   6) Follow up in 4 weeks to assess response

## 2017-09-07 ENCOUNTER — Encounter: Payer: Self-pay | Admitting: Obstetrics and Gynecology

## 2017-09-07 ENCOUNTER — Ambulatory Visit: Payer: 59 | Admitting: Obstetrics and Gynecology

## 2017-09-07 ENCOUNTER — Ambulatory Visit (INDEPENDENT_AMBULATORY_CARE_PROVIDER_SITE_OTHER): Payer: 59 | Admitting: Obstetrics and Gynecology

## 2017-09-07 VITALS — BP 126/78 | HR 100 | Ht 65.0 in | Wt 258.0 lb

## 2017-09-07 DIAGNOSIS — Z6841 Body Mass Index (BMI) 40.0 and over, adult: Secondary | ICD-10-CM

## 2017-09-07 MED ORDER — PHENTERMINE HCL 37.5 MG PO TABS: 37.5000 mg | ORAL_TABLET | Freq: Every day | ORAL | 0 refills | Status: DC

## 2017-09-07 NOTE — Progress Notes (Signed)
Gynecology Office Visit  Chief Complaint:  Chief Complaint  Patient presents with  . Weight Check    History of Present Illness: Patientis a [name], who presents for the evaluation of the desire to lose weight. She has lost 7 pounds for 2 month total of 18lbs. The patient states the following symptoms since starting her weight loss therapy: appetite suppression, energy, and weight loss.  The patient also reports no other ill effects. The patient specifically denies heart palpitations, anxiety, and insomnia.    Review of Systems: 10 point review of systems negative unless otherwise noted in HPI  Past Medical History:  Past Medical History:  Diagnosis Date  . Chronic low back pain    mobic  . GERD (gastroesophageal reflux disease)    on dexiant  . Hypertension    on losartan-will take dos  . Seasonal allergies     Past Surgical History:  Past Surgical History:  Procedure Laterality Date  . COLONOSCOPY    . DILITATION & CURRETTAGE/HYSTROSCOPY WITH NOVASURE ABLATION N/A 06/15/2013   Procedure: DILATATION & CURETTAGE/HYSTEROSCOPY WITH NOVASURE ABLATION;  Surgeon: Lenoard Aden, MD;  Location: [facility];  Service: Gynecology;  Laterality: N/A;  . ESOPHAGOGASTRODUODENOSCOPY    . TOE SURGERY    . TUBAL LIGATION      Gynecologic History: No LMP recorded. Patient has had an ablation.  Obstetric History: G2P2  Family History:  No family history on file.  Social History:  Social History   Social History  . Marital status: Divorced    Spouse name: N/A  . Number of children: N/A  . Years of education: N/A   Occupational History  . Not on file.   Social History Main Topics  . Smoking status: Never Smoker  . Smokeless tobacco: Never Used  . Alcohol use Yes  . Drug use: No  . Sexual activity: Yes    Partners: Male    Birth control/ protection: Surgical     Comment: Ablation   Other Topics Concern  . Not on file   Social History Narrative  . No  narrative on file    Allergies:  Allergies  Allergen Reactions  . Elavil [Amitriptyline] Other (See Comments)    Urine retention  . Topamax [Topiramate] Other (See Comments)    Numbness in fingertips    Medications: Prior to Admission medications   Medication Sig Start Date End Date Taking? Authorizing Provider  ALPRAZolam Prudy Feeler) 1 MG tablet Take 1 mg by mouth 2 (two) times daily. 06/16/17   [provider]  cyclobenzaprine (FLEXERIL) 10 MG tablet  06/10/17   [provider]  esomeprazole (NEXIUM) 40 MG capsule  05/31/17   [provider]  FLUoxetine (PROZAC) 40 MG capsule  05/31/17   [provider]  hydrochlorothiazide (HYDRODIURIL) 25 MG tablet  05/31/17   [provider]  losartan (COZAAR) 100 MG tablet  05/31/17   [provider]  meloxicam (MOBIC) 15 MG tablet Take 15 mg by mouth daily.    [provider]  oxyCODONE-acetaminophen (PERCOCET) 10-325 MG tablet TAKE ONE TABLET BY MOUTH FOUR TIMES DAILY AS NEEDED FOR PAIN 06/16/17   [provider]  phentermine (ADIPEX-P) 37.5 MG tablet Take 1 tablet (37.5 mg total) by mouth daily before breakfast. 08/09/17   Vena Austria, MD  promethazine (PHENERGAN) 25 MG tablet  06/16/17   [provider]    Physical Exam Blood pressure 126/78, pulse 100, height  (1.651 m), weight  258 lb (117 kg). Body mass index is 42.93 kg/m.  General: NAD HEENT: normocephalic, anicteric Thyroid: no enlargement Pulmonary: no increased work of breathing Neurologic: Grossly intact Psychiatric: mood appropriate, affect full  Assessment: 51 y.o. G2P2 medical weight loss follow up  Plan: Problem List Items Addressed This Visit    None    Visit Diagnoses    Class 3 severe obesity without serious comorbidity with body mass index (BMI) of 40.0 to 44.9 in adult, unspecified obesity type (HCC)    -  Primary   Relevant Medications   phentermine (ADIPEX-P) 37.5 MG tablet       1) 1500 Calorie ADA Diet  2) Patient education given regarding appropriate lifestyle changes for weight loss including: regular physical activity, healthy coping strategies, caloric restriction and healthy eating patterns.  3) Patient will be started on weight loss medication. The risks and benefits and side effects of medication, such as Adipex (Phenteramine) ,  Tenuate (Diethylproprion), Belviq (lorcarsin), Contrave (buproprion/naltrexone), Qsymia (phentermine/topiramate), and Saxenda (liraglutide) is discussed. The pros and cons of suppressing appetite and boosting metabolism is discussed. Risks of tolerence and addiction is discussed for selected agents discussed. Use of medicine will ne short term, such as 3-4 months at a time followed by a period of time off of the medicine to avoid these risks and side effects for Adipex, Qsymia, and Tenuate discussed. Pt to call with any negative side effects and agrees to keep follow up appts.  4) Patient to take medication, with the benefits of appetite suppression and metabolism boost d/w pt, along with the side effects and risk factors of long term use that will be avoided with our use of short bursts of therapy. Rx provided.    5) 15 minutes face-to-face; with counseling/coordination of care > 50 percent of visit related to obesity and ongoing management/treatment   6) Follow up in 4 weeks to assess response

## 2017-10-05 ENCOUNTER — Encounter: Payer: Self-pay | Admitting: Obstetrics and Gynecology

## 2017-10-05 ENCOUNTER — Ambulatory Visit: Payer: 59 | Admitting: Obstetrics and Gynecology

## 2017-10-05 VITALS — BP 124/78 | HR 97 | Ht 65.0 in | Wt 251.0 lb

## 2017-10-05 DIAGNOSIS — Z6841 Body Mass Index (BMI) 40.0 and over, adult: Secondary | ICD-10-CM

## 2017-10-05 MED ORDER — PHENTERMINE HCL 37.5 MG PO TABS: 37.5000 mg | ORAL_TABLET | Freq: Every day | ORAL | 0 refills | Status: DC

## 2017-10-05 NOTE — Progress Notes (Signed)
Gynecology Office Visit  Chief Complaint:  Chief Complaint  Patient presents with  . Weight Check    History of Present Illness: Patientis a [name], who presents for the evaluation of the desire to lose weight. She has lost 7 pounds for a 3 month total weight loss of 25lbs. The patient states the following symptoms since starting her weight loss therapy: appetite suppression, energy, and weight loss.  The patient also reports no other ill effects. The patient specifically denies heart palpitations, anxiety, and insomnia.    Review of Systems: 10 point review of systems negative unless otherwise noted in HPI  Past Medical History:  Past Medical History:  Diagnosis Date  . Chronic low back pain    mobic  . GERD (gastroesophageal reflux disease)    on dexiant  . Hypertension    on losartan-will take dos  . Seasonal allergies     Past Surgical History:  Past Surgical History:  Procedure Laterality Date  . COLONOSCOPY    . ESOPHAGOGASTRODUODENOSCOPY    . TOE SURGERY    . TUBAL LIGATION      Gynecologic History: No LMP recorded. Patient has had an ablation.  Obstetric History: G2P2  Family History:  History reviewed. No pertinent family history.  Social History:  Social History   Socioeconomic History  . Marital status: Divorced    Spouse name: Not on file  . Number of children: Not on file  . Years of education: Not on file  . Highest education level: Not on file  Social Needs  . Financial resource strain: Not on file  . Food insecurity - worry: Not on file  . Food insecurity - inability: Not on file  . Transportation needs - medical: Not on file  . Transportation needs - non-medical: Not on file  Occupational History  . Not on file  Tobacco Use  . Smoking status: Never Smoker  . Smokeless tobacco: Never Used  Substance and Sexual Activity  . Alcohol use: Yes  . Drug use: No  . Sexual activity: Yes    Partners: Male    Birth  control/protection: Surgical    Comment: Ablation  Other Topics Concern  . Not on file  Social History Narrative  . Not on file    Allergies:  Allergies  Allergen Reactions  . Elavil [Amitriptyline] Other (See Comments)    Urine retention  . Topamax [Topiramate] Other (See Comments)    Numbness in fingertips    Medications: Prior to Admission medications   Medication Sig Start Date End Date Taking? Authorizing Provider  ALPRAZolam Prudy Feeler(XANAX) 1 MG tablet Take 1 mg by mouth 2 (two) times daily. 06/16/17  Yes [provider]  cyclobenzaprine (FLEXERIL) 10 MG tablet  06/10/17  Yes [provider]  esomeprazole (NEXIUM) 40 MG capsule  05/31/17  Yes [provider]  FLUoxetine (PROZAC) 40 MG capsule  05/31/17  Yes [provider]  hydrochlorothiazide (HYDRODIURIL) 25 MG tablet  05/31/17  Yes [provider]  losartan (COZAAR) 100 MG tablet  05/31/17  Yes [provider]  meloxicam (MOBIC) 15 MG tablet Take 15 mg by mouth daily.   Yes [provider]  oxyCODONE-acetaminophen (PERCOCET) 10-325 MG tablet TAKE ONE TABLET BY MOUTH FOUR TIMES DAILY AS NEEDED FOR PAIN 06/16/17  Yes [provider]  phentermine (ADIPEX-P) 37.5 MG tablet Take 1 tablet (37.5 mg total) by mouth daily before breakfast. 09/07/17  Yes Vena AustriaStaebler, Icarus Partch, MD  promethazine [facility](PHENERGAN)  25 MG tablet  06/16/17  Yes [provider]    Physical Exam Blood pressure 124/78, pulse 97, height 5\' 5"  (1.651 m), weight 251 lb (113.9 kg). Body mass index is 41.77 kg/m.  General: NAD HEENT: normocephalic, anicteric Thyroid: no enlargement Pulmonary: no increased work of breathing Neurologic: Grossly intact Psychiatric: mood appropriate, affect full  Assessment: 51 y.o. G2P2 medical weight loss follow up  Plan: Problem List Items Addressed This Visit    None    Visit Diagnoses    Class 3 severe obesity with serious comorbidity and body mass index (BMI) of  40.0 to 44.9 in adult, unspecified obesity type (HCC)    -  Primary   Relevant Medications   phentermine (ADIPEX-P) 37.5 MG tablet      1) 1500 Calorie ADA Diet  2) Patient education given regarding appropriate lifestyle changes for weight loss including: regular physical activity, healthy coping strategies, caloric restriction and healthy eating patterns.  3) Patient will be started on weight loss medication. The risks and benefits and side effects of medication, such as Adipex (Phenteramine) ,  Tenuate (Diethylproprion), Belviq (lorcarsin), Contrave (buproprion/naltrexone), Qsymia (phentermine/topiramate), and Saxenda (liraglutide) is discussed. The pros and cons of suppressing appetite and boosting metabolism is discussed. Risks of tolerence and addiction is discussed for selected agents discussed. Use of medicine will ne short term, such as 3-4 months at a time followed by a period of time off of the medicine to avoid these risks and side effects for Adipex, Qsymia, and Tenuate discussed. Pt to call with any negative side effects and agrees to keep follow up appts. - one additional month of phentermine then discussed contrave vs Belviq maintenance   4) Patient to take medication, with the benefits of appetite suppression and metabolism boost d/w pt, along with the side effects and risk factors of long term use that will be avoided with our use of short bursts of therapy. Rx provided.    5) 15 minutes face-to-face; with counseling/coordination of care > 50 percent of visit related to obesity and ongoing management/treatment   6) Follow up in 4 weeks to assess response

## 2017-10-05 NOTE — Patient Instructions (Addendum)
Contrave - may increase blood pressure so I'd watch that closely with this drug Belviq - does not increase blood pressure but I've seen less success with it

## 2017-11-01 ENCOUNTER — Ambulatory Visit: Payer: 59 | Admitting: Obstetrics and Gynecology

## 2017-11-09 ENCOUNTER — Ambulatory Visit (INDEPENDENT_AMBULATORY_CARE_PROVIDER_SITE_OTHER): Payer: 59 | Admitting: Obstetrics and Gynecology

## 2017-11-09 ENCOUNTER — Encounter: Payer: Self-pay | Admitting: Obstetrics and Gynecology

## 2017-11-09 VITALS — BP 134/82 | HR 104 | Ht 65.0 in | Wt 247.0 lb

## 2017-11-09 DIAGNOSIS — Z6841 Body Mass Index (BMI) 40.0 and over, adult: Secondary | ICD-10-CM | POA: Diagnosis not present

## 2017-11-09 MED ORDER — LORCASERIN HCL ER 20 MG PO TB24: 1.0000 | ORAL_TABLET | Freq: Every day | ORAL | 2 refills | Status: DC

## 2017-11-09 NOTE — Progress Notes (Signed)
Gynecology Office Visit  Chief Complaint:  Chief Complaint  Patient presents with  . Weight Check    History of Present Illness: Patientis a [name], who presents for the evaluation of the desire to lose weight. She has lost 5 pounds for 4 month total of 30lbs. The patient states the following symptoms since starting her weight loss therapy: appetite suppression, energy, and weight loss.  The patient also reports no other ill effects. The patient specifically denies heart palpitations, anxiety, and insomnia.    Review of Systems: 10 point review of systems negative unless otherwise noted in HPI  Past Medical History:  Past Medical History:  Diagnosis Date  . Chronic low back pain    mobic  . GERD (gastroesophageal reflux disease)    on dexiant  . Hypertension    on losartan-will take dos  . Seasonal allergies     Past Surgical History:  Past Surgical History:  Procedure Laterality Date  . COLONOSCOPY    . DILITATION & CURRETTAGE/HYSTROSCOPY WITH NOVASURE ABLATION N/A 06/15/2013   Procedure: DILATATION & CURETTAGE/HYSTEROSCOPY WITH NOVASURE ABLATION;  Surgeon: Lenoard Adenichard J Taavon, MD;  Location: [facility];  Service: Gynecology;  Laterality: N/A;  . ESOPHAGOGASTRODUODENOSCOPY    . TOE SURGERY    . TUBAL LIGATION      Gynecologic History: No LMP recorded. Patient has had an ablation.  Obstetric History: G2P2  Family History:  No family history on file.  Social History:  Social History   Socioeconomic History  . Marital status: Divorced    Spouse name: Not on file  . Number of children: Not on file  . Years of education: Not on file  . Highest education level: Not on file  Social Needs  . Financial resource strain: Not on file  . Food insecurity - worry: Not on file  . Food insecurity - inability: Not on file  . Transportation needs - medical: Not on file  . Transportation needs - non-medical: Not on file  Occupational History  . Not on file    Tobacco Use  . Smoking status: Never Smoker  . Smokeless tobacco: Never Used  Substance and Sexual Activity  . Alcohol use: Yes  . Drug use: No  . Sexual activity: Yes    Partners: Male    Birth control/protection: Surgical    Comment: Ablation  Other Topics Concern  . Not on file  Social History Narrative  . Not on file    Allergies:  Allergies  Allergen Reactions  . Elavil [Amitriptyline] Other (See Comments)    Urine retention  . Topamax [Topiramate] Other (See Comments)    Numbness in fingertips    Medications: Prior to Admission medications   Medication Sig Start Date End Date Taking? Authorizing Provider  ALPRAZolam Prudy Feeler(XANAX) 1 MG tablet Take 1 mg by mouth 2 (two) times daily. 06/16/17   [provider]  cyclobenzaprine (FLEXERIL) 10 MG tablet  06/10/17   [provider]  esomeprazole (NEXIUM) 40 MG capsule  05/31/17   [provider]  FLUoxetine (PROZAC) 40 MG capsule  05/31/17   [provider]  hydrochlorothiazide (HYDRODIURIL) 25 MG tablet  05/31/17   [provider]  losartan (COZAAR) 100 MG tablet  05/31/17   [provider]  meloxicam (MOBIC) 15 MG tablet Take 15 mg by mouth daily.    [provider]  oxyCODONE-acetaminophen (PERCOCET) 10-325 MG tablet TAKE ONE TABLET BY MOUTH FOUR TIMES DAILY AS NEEDED FOR PAIN  06/16/17   [provider]  phentermine (ADIPEX-P) 37.5 MG tablet Take 1 tablet (37.5 mg total) daily before breakfast by mouth. 10/05/17   Vena AustriaStaebler, Averee Harb, MD  promethazine (PHENERGAN) 25 MG tablet  06/16/17   [provider]  triamterene-hydrochlorothiazide (MAXZIDE) 75-50 MG tablet  10/19/17   [provider]    Physical Exam Blood pressure 134/82, pulse (!) 104, height 5\' 5"  (1.651 m), weight 247 lb (112 kg).  Body mass index is 41.1 kg/m.  General: NAD HEENT: normocephalic, anicteric Thyroid: no enlargement Pulmonary: no increased work of breathing Neurologic:  Grossly intact Psychiatric: mood appropriate, affect full  Assessment: 51 y.o. G2P2 No problem-specific Assessment & Plan notes found for this encounter.   Plan: Problem List Items Addressed This Visit    None    Visit Diagnoses    Class 3 severe obesity with serious comorbidity and body mass index (BMI) of 40.0 to 44.9 in adult, unspecified obesity type (HCC)    -  Primary   Relevant Medications   Lorcaserin HCl ER (BELVIQ XR) 20 MG TB24      1) 1500 Calorie ADA Diet  2) Patient education given regarding appropriate lifestyle changes for weight loss including: regular physical activity, healthy coping strategies, caloric restriction and healthy eating patterns.  3) Patient will be started on weight loss medication. The risks and benefits and side effects of medication, such as Adipex (Phenteramine) ,  Tenuate (Diethylproprion), Belviq (lorcarsin), Contrave (buproprion/naltrexone), Qsymia (phentermine/topiramate), and Saxenda (liraglutide) is discussed. The pros and cons of suppressing appetite and boosting metabolism is discussed. Risks of tolerence and addiction is discussed for selected agents discussed. Use of medicine will ne short term, such as 3-4 months at a time followed by a period of time off of the medicine to avoid these risks and side effects for Adipex, Qsymia, and Tenuate discussed. Pt to call with any negative side effects and agrees to keep follow up appts. - discontinue phentermine, switch to belviq  4) Patient to take medication, with the benefits of appetite suppression and metabolism boost d/w pt, along with the side effects and risk factors of long term use that will be avoided with our use of short bursts of therapy. Rx provided.    5) 15 minutes face-to-face; with counseling/coordination of care > 50 percent of visit related to obesity and ongoing management/treatment   6) Follow up in 4 weeks to assess response

## 2017-11-17 ENCOUNTER — Ambulatory Visit: Payer: 59 | Admitting: Obstetrics and Gynecology

## 2018-02-09 ENCOUNTER — Encounter: Payer: Self-pay | Admitting: Obstetrics and Gynecology

## 2018-02-09 ENCOUNTER — Ambulatory Visit (INDEPENDENT_AMBULATORY_CARE_PROVIDER_SITE_OTHER): Payer: BLUE CROSS/BLUE SHIELD | Admitting: Obstetrics and Gynecology

## 2018-02-09 VITALS — BP 140/86 | HR 105 | Ht 65.0 in | Wt 240.0 lb

## 2018-02-09 DIAGNOSIS — Z6839 Body mass index (BMI) 39.0-39.9, adult: Secondary | ICD-10-CM | POA: Diagnosis not present

## 2018-02-09 DIAGNOSIS — E669 Obesity, unspecified: Secondary | ICD-10-CM | POA: Diagnosis not present

## 2018-02-09 MED ORDER — PHENTERMINE HCL 37.5 MG PO TABS: 37.5000 mg | ORAL_TABLET | Freq: Every day | ORAL | 0 refills | Status: DC

## 2018-02-09 NOTE — Progress Notes (Signed)
Gynecology Office Visit  Chief Complaint:  Chief Complaint  Patient presents with  . Weight Check    History of Present Illness: Patientis a 52 y.o. G2P2 female, who presents for the evaluation of the desire to lose weight. She has lost 7 pounds.  She has lost this unassisted, she never started the Belviq. She is interested in restarting phentermine.  Knee replacement scheduled in the near future (both knees in 4 week intervals).  She is interested and motivated in continuing to loose weight to assist with her postoperative recovery.    Review of Systems: 10 point review of systems negative unless otherwise noted in HPI  Past Medical History:  Past Medical History:  Diagnosis Date  . Chronic low back pain    mobic  . GERD (gastroesophageal reflux disease)    on dexiant  . Hypertension    on losartan-will take dos  . Seasonal allergies     Past Surgical History:  Past Surgical History:  Procedure Laterality Date  . COLONOSCOPY    . DILITATION & CURRETTAGE/HYSTROSCOPY WITH NOVASURE ABLATION N/A 06/15/2013   Procedure: DILATATION & CURETTAGE/HYSTEROSCOPY WITH NOVASURE ABLATION;  Surgeon: Lenoard Aden, MD;  Location: [facility];  Service: Gynecology;  Laterality: N/A;  . ESOPHAGOGASTRODUODENOSCOPY    . TOE SURGERY    . TUBAL LIGATION      Gynecologic History: No LMP recorded. Patient has had an ablation.  Obstetric History: G2P2  Family History:  No family history on file.  Social History:  Social History   Socioeconomic History  . Marital status: Divorced    Spouse name: Not on file  . Number of children: Not on file  . Years of education: Not on file  . Highest education level: Not on file  Social Needs  . Financial resource strain: Not on file  . Food insecurity - worry: Not on file  . Food insecurity - inability: Not on file  . Transportation needs - medical: Not on file  . Transportation needs - non-medical: Not on file  Occupational History  . Not on  file  Tobacco Use  . Smoking status: Never Smoker  . Smokeless tobacco: Never Used  Substance and Sexual Activity  . Alcohol use: Yes  . Drug use: No  . Sexual activity: Yes    Partners: Male    Birth control/protection: Surgical    Comment: Ablation  Other Topics Concern  . Not on file  Social History Narrative  . Not on file    Allergies:  Allergies  Allergen Reactions  . Elavil [Amitriptyline] Other (See Comments)    Urine retention  . Topamax [Topiramate] Other (See Comments)    Numbness in fingertips    Medications: Prior to Admission medications   Medication Sig Start Date End Date Taking? Authorizing Provider  ALPRAZolam Prudy Feeler) 1 MG tablet Take 1 mg by mouth 2 (two) times daily. 06/16/17   [provider]  cyclobenzaprine (FLEXERIL) 10 MG tablet  06/10/17   [provider]  esomeprazole (NEXIUM) 40 MG capsule  05/31/17   [provider]  FLUoxetine (PROZAC) 40 MG capsule  05/31/17   [provider]  hydrochlorothiazide (HYDRODIURIL) 25 MG tablet  05/31/17   [provider]  Lorcaserin HCl ER (BELVIQ XR) 20 MG TB24 Take 1 tablet by mouth daily. 11/09/17   Vena Austria, MD  losartan (COZAAR) 100 MG tablet  05/31/17   [provider]  meloxicam (MOBIC) 15 MG tablet Take 15 mg by  mouth daily.    [provider]  oxyCODONE-acetaminophen (PERCOCET) 10-325 MG tablet TAKE ONE TABLET BY MOUTH FOUR TIMES DAILY AS NEEDED FOR PAIN 06/16/17   [provider]  phentermine (ADIPEX-P) 37.5 MG tablet Take 1 tablet (37.5 mg total) daily before breakfast by mouth. 10/05/17   Vena AustriaStaebler, Lochlan Grygiel, MD  promethazine (PHENERGAN) 25 MG tablet  06/16/17   [provider]  triamterene-hydrochlorothiazide (MAXZIDE) 75-50 MG tablet  10/19/17   [provider]    Physical Exam Blood pressure 140/86, pulse (!) 105, height 5\' 5"  (1.651 m), weight 240 lb (108.9 kg). Wt Readings from Last 3 Encounters:  02/09/18 240  lb (108.9 kg)  11/09/17 247 lb (112 kg)  10/05/17 251 lb (113.9 kg)   Body mass index is 39.94 kg/m.   General: NAD HEENT: normocephalic, anicteric Thyroid: no enlargement Pulmonary: no increased work of breathing Neurologic: Grossly intact Psychiatric: mood appropriate, affect full  Assessment: 52 y.o. G2P2 Class 2 obesity without serious comorbidity with body mass index (BMI) of 39.0 to 39.9 in adult Currently undergoing medical weight loss management with phentermine.  Weight related comorbidity include degenerative knee changes scheduled to undergo knee replacement.   Plan: Problem List Items Addressed This Visit      Other   Class 2 obesity without serious comorbidity with body mass index (BMI) of 39.0 to 39.9 in adult - Primary    Currently undergoing medical weight loss management with phentermine.  Weight related comorbidity include degenerative knee changes scheduled to undergo knee replacement.      Relevant Medications   phentermine (ADIPEX-P) 37.5 MG tablet      1) 1500 Calorie ADA Diet  2) Patient education given regarding appropriate lifestyle changes for weight loss including: regular physical activity, healthy coping strategies, caloric restriction and healthy eating patterns.  3) Patient will be started on weight loss medication. The risks and benefits and side effects of medication, such as Adipex (Phenteramine) ,  Tenuate (Diethylproprion), Belviq (lorcarsin), Contrave (buproprion/naltrexone), Qsymia (phentermine/topiramate), and Saxenda (liraglutide) is discussed. The pros and cons of suppressing appetite and boosting metabolism is discussed. Risks of tolerence and addiction is discussed for selected agents discussed. Use of medicine will ne short term, such as 3-4 months at a time followed by a period of time off of the medicine to avoid these risks and side effects for Adipex, Qsymia, and Tenuate discussed. Pt to call with any negative side effects and agrees  to keep follow up appts.  4) Patient to take medication, with the benefits of appetite suppression and metabolism boost d/w pt, along with the side effects and risk factors of long term use that will be avoided with our use of short bursts of therapy. Rx provided.    5) 15 minutes face-to-face; with counseling/coordination of care > 50 percent of visit related to obesity and ongoing management/treatment   6)  Return in about 4 weeks (around 03/09/2018) for wt check.    Vena AustriaAndreas Si Jachim, MD, Evern CoreFACOG [facility], [facility] 02/10/2018, 1:29 PM  Getting ready to have knee replacement 7 lbs down from last visit unassited did not start Belviq

## 2018-02-10 DIAGNOSIS — E669 Obesity, unspecified: Secondary | ICD-10-CM | POA: Insufficient documentation

## 2018-02-10 DIAGNOSIS — Z6839 Body mass index (BMI) 39.0-39.9, adult: Secondary | ICD-10-CM

## 2018-02-10 NOTE — Assessment & Plan Note (Signed)
Currently undergoing medical weight loss management with phentermine.  Weight related comorbidity include degenerative knee changes scheduled to undergo knee replacement.

## 2018-02-13 ENCOUNTER — Other Ambulatory Visit: Payer: Self-pay | Admitting: Obstetrics and Gynecology

## 2018-02-17 ENCOUNTER — Encounter (HOSPITAL_COMMUNITY): Payer: Self-pay | Admitting: *Deleted

## 2018-02-17 NOTE — Patient Instructions (Signed)
Amanda Taylor  02/17/2018   Your procedure is scheduled on:   Monday ,  04/ 01/ 2019  Report to [facility] Main  Entrance  Report to admitting at   10:30AM   Call this number if you have problems the morning of surgery [phone]   Remember: Do not eat food After Midnight with exception clear liquids until 7:00AM     Take these medicines the morning of surgery with A SIP OF WATER:                                You may not have any metal on your body including hair pins and              piercings  Do not wear jewelry, make-up, lotions, powders or perfumes, deodorant             Do not wear nail polish.  Do not shave  48 hours prior to surgery.              Men may shave face and neck.   Do not bring valuables to the hospital. [facility] IS NOT             RESPONSIBLE   FOR VALUABLES.  Contacts, dentures or bridgework may not be worn into surgery.  Leave suitcase in the car. After surgery it may be brought to your room.    Special Instructions:   Deep breathing and leg exercises   CLEAR LIQUID DIET   Foods Allowed                                                                     Foods Excluded  Coffee and tea, regular and decaf                             liquids that you cannot  Plain Jell-O in any flavor                                             see through such as: Fruit ices (not with fruit pulp)                                     milk, soups, orange juice  Iced Popsicles                                    All solid food Carbonated beverages, regular and diet                                    Cranberry, grape and apple juices Sports drinks like Gatorade Lightly seasoned clear broth or consume(fat free) Sugar, honey syrup  Sample Menu  Breakfast                                Lunch                                     Supper Cranberry juice                    Beef broth                            Chicken broth Jell-O                                      Grape juice                           Apple juice Coffee or tea                        Jell-O                                      Popsicle                                                Coffee or tea                        Coffee or tea  _____________________________________________________________________                Please read over the following fact sheets you were given: _____________________________________________________________________             [facility] - Preparing for Surgery Before surgery, you can play an important role.  Because skin is not sterile, your skin needs to be as free of germs as possible.  You can reduce the number of germs on your skin by washing with CHG (chlorahexidine gluconate) soap before surgery.  CHG is an antiseptic cleaner which kills germs and bonds with the skin to continue killing germs even after washing. Please DO NOT use if you have an allergy to CHG or antibacterial soaps.  If your skin becomes reddened/irritated stop using the CHG and inform your nurse when you arrive at Short Stay. Do not shave (including legs and underarms) for at least 48 hours prior to the first CHG shower.  You may shave your face/neck. Please follow these instructions carefully:  1.  Shower with CHG Soap the night before surgery and the  morning of Surgery.  2.  If you choose to wash your hair, wash your hair first as usual with your  normal  shampoo.  3.  After you shampoo, rinse your hair and body thoroughly to remove the  shampoo.                           4.  Use CHG as you would any other liquid soap.  You can apply chg directly  to the skin and wash                       Gently with a scrungie or clean washcloth.  5.  Apply the CHG Soap to your body ONLY FROM THE NECK DOWN.   Do not use on face/ open                           Wound or open sores. Avoid contact with eyes, ears mouth and genitals (private parts).                       Wash face,   Genitals (private parts) with your normal soap.             6.  Wash thoroughly, paying special attention to the area where your surgery  will be performed.  7.  Thoroughly rinse your body with warm water from the neck down.  8.  DO NOT shower/wash with your normal soap after using and rinsing off  the CHG Soap.                9.  Pat yourself dry with a clean towel.            10.  Wear clean pajamas.            11.  Place clean sheets on your bed the night of your first shower and do not  sleep with pets. Day of Surgery : Do not apply any lotions/deodorants the morning of surgery.  Please wear clean clothes to the hospital/surgery center.  FAILURE TO FOLLOW THESE INSTRUCTIONS MAY RESULT IN THE CANCELLATION OF YOUR SURGERY PATIENT SIGNATURE_________________________________  NURSE SIGNATURE__________________________________  ________________________________________________________________________   Amanda MireIncentive Spirometer  An incentive spirometer is a tool that can help keep your lungs clear and active. This tool measures how well you are filling your lungs with each breath. Taking long deep breaths may help reverse or decrease the chance of developing breathing (pulmonary) problems (especially infection) following:  A long period of time when you are unable to move or be active. BEFORE THE PROCEDURE   If the spirometer includes an indicator to show your best effort, your nurse or respiratory therapist will set it to a desired goal.  If possible, sit up straight or lean slightly forward. Try not to slouch.  Hold the incentive spirometer in an upright position. INSTRUCTIONS FOR USE  1. Sit on the edge of your bed if possible, or sit up as far as you can in bed or on a chair. 2. Hold the incentive spirometer in an upright position. 3. Breathe out normally. 4. Place the mouthpiece in your mouth and seal your lips tightly around it. 5. Breathe in slowly and as deeply as possible, raising  the piston or the ball toward the top of the column. 6. Hold your breath for 3-5 seconds or for as long as possible. Allow the piston or ball to fall to the bottom of the column. 7. Remove the mouthpiece from your mouth and breathe out normally. 8. Rest for a few seconds and repeat Steps 1 through 7 at least 10 times every 1-2 hours when you are awake. Take your time and take a few normal breaths between deep breaths. 9. The spirometer may include an indicator to show your best effort. Use the indicator as a goal to work  toward during each repetition. 10. After each set of 10 deep breaths, practice coughing to be sure your lungs are clear. If you have an incision (the cut made at the time of surgery), support your incision when coughing by placing a pillow or rolled up towels firmly against it. Once you are able to get out of bed, walk around indoors and cough well. You may stop using the incentive spirometer when instructed by your caregiver.  RISKS AND COMPLICATIONS  Take your time so you do not get dizzy or light-headed.  If you are in pain, you may need to take or ask for pain medication before doing incentive spirometry. It is harder to take a deep breath if you are having pain. AFTER USE  Rest and breathe slowly and easily.  It can be helpful to keep track of a log of your progress. Your caregiver can provide you with a simple table to help with this. If you are using the spirometer at home, follow these instructions: SEEK MEDICAL CARE IF:   You are having difficultly using the spirometer.  You have trouble using the spirometer as often as instructed.  Your pain medication is not giving enough relief while using the spirometer.  You develop fever of 100.5 F (38.1 C) or higher. SEEK IMMEDIATE MEDICAL CARE IF:   You cough up bloody sputum that had not been present before.  You develop fever of 102 F (38.9 C) or greater.  You develop worsening pain at or near the incision  site. MAKE SURE YOU:   Understand these instructions.  Will watch your condition.  Will get help right away if you are not doing well or get worse. Document Released: 03/28/2007 Document Revised: 02/07/2012 Document Reviewed: 05/29/2007 [employer] Patient Information 2014 [employer], [place].   ________________________________________________________________________  WHAT IS A BLOOD TRANSFUSION? Blood Transfusion Information  A transfusion is the replacement of blood or some of its parts. Blood is made up of multiple cells which provide different functions.  Red blood cells carry oxygen and are used for blood loss replacement.  White blood cells fight against infection.  Platelets control bleeding.  Plasma helps clot blood.  Other blood products are available for specialized needs, such as hemophilia or other clotting disorders. BEFORE THE TRANSFUSION  Who gives blood for transfusions?   Healthy volunteers who are fully evaluated to make sure their blood is safe. This is blood bank blood. Transfusion therapy is the safest it has ever been in the practice of medicine. Before blood is taken from a donor, a complete history is taken to make sure that person has no history of diseases nor engages in risky social behavior (examples are intravenous drug use or sexual activity with multiple partners). The donor's travel history is screened to minimize risk of transmitting infections, such as malaria. The donated blood is tested for signs of infectious diseases, such as HIV and hepatitis. The blood is then tested to be sure it is compatible with you in order to minimize the chance of a transfusion reaction. If you or a relative donates blood, this is often done in anticipation of surgery and is not appropriate for emergency situations. It takes many days to process the donated blood. RISKS AND COMPLICATIONS Although transfusion therapy is very safe and saves many lives, the main dangers of  transfusion include:   Getting an infectious disease.  Developing a transfusion reaction. This is an allergic reaction to something in the blood you were given. Every precaution is taken  to prevent this. The decision to have a blood transfusion has been considered carefully by your caregiver before blood is given. Blood is not given unless the benefits outweigh the risks. AFTER THE TRANSFUSION  Right after receiving a blood transfusion, you will usually feel much better and more energetic. This is especially true if your red blood cells have gotten low (anemic). The transfusion raises the level of the red blood cells which carry oxygen, and this usually causes an energy increase.  The nurse administering the transfusion will monitor you carefully for complications. HOME CARE INSTRUCTIONS  No special instructions are needed after a transfusion. You may find your energy is better. Speak with your caregiver about any limitations on activity for underlying diseases you may have. SEEK MEDICAL CARE IF:   Your condition is not improving after your transfusion.  You develop redness or irritation at the intravenous (IV) site. SEEK IMMEDIATE MEDICAL CARE IF:  Any of the following symptoms occur over the next 12 hours:  Shaking chills.  You have a temperature by mouth above 102 F (38.9 C), not controlled by medicine.  Chest, back, or muscle pain.  People around you feel you are not acting correctly or are confused.  Shortness of breath or difficulty breathing.  Dizziness and fainting.  You get a rash or develop hives.  You have a decrease in urine output.  Your urine turns a dark color or changes to pink, red, or brown. Any of the following symptoms occur over the next 10 days:  You have a temperature by mouth above 102 F (38.9 C), not controlled by medicine.  Shortness of breath.  Weakness after normal activity.  The white part of the eye turns yellow (jaundice).  You have a  decrease in the amount of urine or are urinating less often.  Your urine turns a dark color or changes to pink, red, or brown. Document Released: 11/12/2000 Document Revised: 02/07/2012 Document Reviewed: 07/01/2008 [facility] Patient Information 2014 [place], [place].  _______________________________________________________________________

## 2018-02-20 NOTE — H&P (Signed)
TOTAL KNEE ADMISSION H&P  Patient is being admitted for right total knee arthroplasty.  Subjective:  Chief Complaint:  Right knee primary OA / pain  HPI: Amanda Taylor, 52 y.o. female, has a history of pain and functional disability in the right knee due to arthritis and has failed non-surgical conservative treatments for greater than 12 weeks to includeNSAID's and/or analgesics, corticosteriod injections, viscosupplementation injections and activity modification.  Onset of symptoms was gradual, starting 3-4 years ago with gradually worsening course since that time. The patient noted no past surgery on the right knee(s).  Patient currently rates pain in the right knee(s) at 10 out of 10 with activity. Patient has night pain, worsening of pain with activity and weight bearing, pain that interferes with activities of daily living, pain with passive range of motion, crepitus and joint swelling.  Patient has evidence of periarticular osteophytes and joint space narrowing by imaging studies.  There is no active infection.  Risks, benefits and expectations were discussed with the patient.  Risks including but not limited to the risk of anesthesia, blood clots, nerve damage, blood vessel damage, failure of the prosthesis, infection and up to and including death.  Patient understand the risks, benefits and expectations and wishes to proceed with surgery.   PCP: System, Pcp Not In  D/C Plans:       Home   Post-op Meds:       No Rx given  Tranexamic Acid:      To be given - IV    Decadron:      Is to be given  FYI:      ASA  Norco  DME:   Rx given for - RW   PT:   OPPT Rx given   Patient Active Problem List   Diagnosis Date Noted  . Class 2 obesity without serious comorbidity with body mass index (BMI) of 39.0 to 39.9 in adult 02/10/2018   Past Medical History:  Diagnosis Date  . Chronic low back pain    mobic  . GERD (gastroesophageal reflux disease)   . Hypertension   . Osteoarthritis  of knee    right  . Seasonal allergies     Past Surgical History:  Procedure Laterality Date  . COLONOSCOPY    . DILITATION & CURRETTAGE/HYSTROSCOPY WITH NOVASURE ABLATION N/A 06/15/2013   Procedure: DILATATION & CURETTAGE/HYSTEROSCOPY WITH NOVASURE ABLATION;  Surgeon: Lenoard Aden, MD;  Location: [facility];  Service: Gynecology;  Laterality: N/A;  . ESOPHAGOGASTRODUODENOSCOPY    . TOE SURGERY    . TUBAL LIGATION      No current facility-administered medications for this encounter.    Current Outpatient Medications  Medication Sig Dispense Refill Last Dose  . ALPRAZolam (XANAX) 1 MG tablet Take 1 mg by mouth 2 (two) times daily.  2 Taking  . cyclobenzaprine (FLEXERIL) 10 MG tablet    Taking  . esomeprazole (NEXIUM) 40 MG capsule    Taking  . FLUoxetine (PROZAC) 40 MG capsule    Taking  . hydrochlorothiazide (HYDRODIURIL) 25 MG tablet    Taking  . Lorcaserin HCl ER (BELVIQ XR) 20 MG TB24 Take 1 tablet by mouth daily. 30 tablet 2   . losartan (COZAAR) 100 MG tablet    Taking  . meloxicam (MOBIC) 15 MG tablet Take 15 mg by mouth daily.   Taking  . oxyCODONE-acetaminophen (PERCOCET) 10-325 MG tablet TAKE ONE TABLET BY MOUTH FOUR TIMES DAILY AS NEEDED FOR PAIN  0 Taking  . phentermine (  ADIPEX-P) 37.5 MG tablet Take 1 tablet (37.5 mg total) by mouth daily before breakfast. 30 tablet 0   . promethazine (PHENERGAN) 25 MG tablet    Taking  . triamterene-hydrochlorothiazide (MAXZIDE) 75-50 MG tablet       Allergies  Allergen Reactions  . Elavil [Amitriptyline] Other (See Comments)    Urine retention  . Topamax [Topiramate] Other (See Comments)    Numbness in fingertips    Social History   Tobacco Use  . Smoking status: Never Smoker  . Smokeless tobacco: Never Used  Substance Use Topics  . Alcohol use: Yes       Review of Systems  Constitutional: Negative.   HENT: Negative.   Eyes: Negative.   Respiratory: Negative.   Cardiovascular: Negative.   Gastrointestinal: Positive  for constipation and heartburn.  Genitourinary: Negative.   Musculoskeletal: Positive for back pain and joint pain.  Skin: Negative.   Neurological: Negative.   Endo/Heme/Allergies: Positive for environmental allergies.  Psychiatric/Behavioral: Negative.     Objective:  Physical Exam  Constitutional: She is oriented to person, place, and time. She appears well-developed.  HENT:  Head: Normocephalic.  Eyes: Pupils are equal, round, and reactive to light.  Neck: Neck supple. No JVD present. No tracheal deviation present. No thyromegaly present.  Cardiovascular: Normal rate, regular rhythm and intact distal pulses.  Respiratory: Effort normal and breath sounds normal. No respiratory distress. She has no wheezes.  GI: Soft. There is no tenderness. There is no guarding.  Musculoskeletal:       Right knee: She exhibits decreased range of motion, swelling and bony tenderness. She exhibits no ecchymosis, no deformity, no laceration and no erythema. Tenderness found.  Lymphadenopathy:    She has no cervical adenopathy.  Neurological: She is alert and oriented to person, place, and time. A sensory deficit (numbness and tingling in the right LE) is present.  Skin: Skin is warm and dry.  Psychiatric: She has a normal mood and affect.     Labs:  Estimated body mass index is 39.94 kg/m as calculated from the following:   Height as of 02/09/18: 5\' 5"  (1.651 m).   Weight as of 02/09/18: 108.9 kg (240 lb).   Imaging Review Plain radiographs demonstrate severe degenerative joint disease of the right knee(s).  The bone quality appears to be good for age and reported activity level.     Assessment/Plan:  End stage arthritis, right knee   The patient history, physical examination, clinical judgment of the provider and imaging studies are consistent with end stage degenerative joint disease of the right knee(s) and total knee arthroplasty is deemed medically necessary. The treatment options  including medical management, injection therapy arthroscopy and arthroplasty were discussed at length. The risks and benefits of total knee arthroplasty were presented and reviewed. The risks due to aseptic loosening, infection, stiffness, patella tracking problems, thromboembolic complications and other imponderables were discussed. The patient acknowledged the explanation, agreed to proceed with the plan and consent was signed. Patient is being admitted for inpatient treatment for surgery, pain control, PT, OT, prophylactic antibiotics, VTE prophylaxis, progressive ambulation and ADL's and discharge planning. The patient is planning to be discharged home.     Anastasio AuerbachMatthew S. Tomeko Scoville   PA-C  02/20/2018, 3:59 PM

## 2018-02-21 ENCOUNTER — Encounter (HOSPITAL_COMMUNITY)
Admission: RE | Admit: 2018-02-21 | Discharge: 2018-02-21 | Disposition: A | Discharge status: Home / Self Care | Payer: BLUE CROSS/BLUE SHIELD | Source: Ambulatory Visit | Attending: Orthopedic Surgery | Admitting: Orthopedic Surgery

## 2018-02-21 ENCOUNTER — Encounter (HOSPITAL_COMMUNITY): Payer: Self-pay

## 2018-02-21 ENCOUNTER — Other Ambulatory Visit: Payer: Self-pay

## 2018-02-21 DIAGNOSIS — Z01812 Encounter for preprocedural laboratory examination: Secondary | ICD-10-CM | POA: Insufficient documentation

## 2018-02-21 DIAGNOSIS — Z01810 Encounter for preprocedural cardiovascular examination: Secondary | ICD-10-CM | POA: Insufficient documentation

## 2018-02-21 DIAGNOSIS — J301 Allergic rhinitis due to pollen: Secondary | ICD-10-CM | POA: Diagnosis not present

## 2018-02-21 DIAGNOSIS — I10 Essential (primary) hypertension: Secondary | ICD-10-CM | POA: Diagnosis not present

## 2018-02-21 DIAGNOSIS — F330 Major depressive disorder, recurrent, mild: Secondary | ICD-10-CM | POA: Diagnosis not present

## 2018-02-21 HISTORY — DX: Diaphragmatic hernia without obstruction or gangrene: K44.9

## 2018-02-21 HISTORY — DX: Other constipation: K59.09

## 2018-02-21 HISTORY — DX: Family history of other specified conditions: Z84.89

## 2018-02-21 LAB — CBC
HEMATOCRIT: 31.6 % — AB (ref 36.0–46.0)
Hemoglobin: 11 g/dL — ABNORMAL LOW (ref 12.0–15.0)
MCH: 27 pg (ref 26.0–34.0)
MCHC: 34.8 g/dL (ref 30.0–36.0)
MCV: 77.6 fL — AB (ref 78.0–100.0)
Platelets: 356 10*3/uL (ref 150–400)
RBC: 4.07 MIL/uL (ref 3.87–5.11)
RDW: 15.5 % (ref 11.5–15.5)
WBC: 9.5 10*3/uL (ref 4.0–10.5)

## 2018-02-21 LAB — ABO/RH: ABO/RH(D): O POS

## 2018-02-21 LAB — BASIC METABOLIC PANEL
ANION GAP: 6 (ref 5–15)
BUN: 13 mg/dL (ref 6–20)
CHLORIDE: 103 mmol/L (ref 101–111)
CO2: 27 mmol/L (ref 22–32)
Calcium: 8.8 mg/dL — ABNORMAL LOW (ref 8.9–10.3)
Creatinine, Ser: 1.06 mg/dL — ABNORMAL HIGH (ref 0.44–1.00)
GFR calc non Af Amer: 60 mL/min — ABNORMAL LOW (ref >60)
Glucose, Bld: 91 mg/dL (ref 65–99)
POTASSIUM: 4 mmol/L (ref 3.5–5.1)
SODIUM: 136 mmol/L (ref 135–145)

## 2018-02-21 LAB — SURGICAL PCR SCREEN
MRSA, PCR: NEGATIVE
STAPHYLOCOCCUS AUREUS: POSITIVE — AB

## 2018-02-21 NOTE — Patient Instructions (Signed)
Amanda Taylor  02/21/2018   Your procedure is scheduled on:  Monday,  February 27, 2018  [facility]  Report to admitting at  10:00 AM    Call this number if you have problems the morning of surgery [phone]   Remember: Do not eat food After Midnight  With exception may have clear liquids until 7:00AM then nothing by mouth     Take these medicines the morning of surgery with A SIP OF WATER:   Xanax  if needed, Nexium,  Stool Softner if needed   CLEAR LIQUID DIET   Foods Allowed                                                                     Foods Excluded  Coffee and tea, regular and decaf                             liquids that you cannot  Plain Jell-O in any flavor                                             see through such as: Fruit ices (not with fruit pulp)                                     milk, soups, orange juice  Iced Popsicles                                    All solid food Carbonated beverages, regular and diet                                    Cranberry, grape and apple juices Sports drinks like Gatorade Lightly seasoned clear broth or consume(fat free) Sugar, honey syrup  Sample Menu Breakfast                                Lunch                                     Supper Cranberry juice                    Beef broth                            Chicken broth Jell-O                                     Grape juice  Apple juice Coffee or tea                        Jell-O                                      Popsicle                                                Coffee or tea                        Coffee or tea  _____________________________________________________________________   DO NOT TAKE ANY DIABETIC MEDICATIONS DAY OF YOUR SURGERY                               You may not have any metal on your body including hair pins and              piercings  Do not wear jewelry,  make-up, lotions, powders or perfumes, deodorant             Do not wear nail polish.  Do not shave  48 hours prior to surgery.              Men may shave face and neck.   Do not bring valuables to the hospital. [facility] IS NOT             RESPONSIBLE   FOR VALUABLES.  Contacts, dentures or bridgework may not be worn into surgery.  Leave suitcase in the car. After surgery it may be brought to your room.     Patients discharged the day of surgery will not be allowed to drive home.  Name and phone number of your driver:  Special Instructions: N/A              Please read over the following fact sheets you were given: _____________________________________________________________________             [facility] - Preparing for Surgery Before surgery, you can play an important role.  Because skin is not sterile, your skin needs to be as free of germs as possible.  You can reduce the number of germs on your skin by washing with CHG (chlorahexidine gluconate) soap before surgery.  CHG is an antiseptic cleaner which kills germs and bonds with the skin to continue killing germs even after washing. Please DO NOT use if you have an allergy to CHG or antibacterial soaps.  If your skin becomes reddened/irritated stop using the CHG and inform your nurse when you arrive at Short Stay. Do not shave (including legs and underarms) for at least 48 hours prior to the first CHG shower.  You may shave your face/neck. Please follow these instructions carefully:  1.  Shower with CHG Soap the night before surgery and the  morning of Surgery.  2.  If you choose to wash your hair, wash your hair first as usual with your  normal  shampoo.  3.  After you shampoo, rinse your hair and body thoroughly to remove the  shampoo.  4.  Use CHG as you would any other liquid soap.  You can apply chg directly  to the skin and wash                       Gently with a scrungie or clean washcloth.  5.   Apply the CHG Soap to your body ONLY FROM THE NECK DOWN.   Do not use on face/ open                           Wound or open sores. Avoid contact with eyes, ears mouth and genitals (private parts).                       Wash face,  Genitals (private parts) with your normal soap.             6.  Wash thoroughly, paying special attention to the area where your surgery  will be performed.  7.  Thoroughly rinse your body with warm water from the neck down.  8.  DO NOT shower/wash with your normal soap after using and rinsing off  the CHG Soap.                9.  Pat yourself dry with a clean towel.            10.  Wear clean pajamas.            11.  Place clean sheets on your bed the night of your first shower and do not  sleep with pets. Day of Surgery : Do not apply any lotions/deodorants the morning of surgery.  Please wear clean clothes to the hospital/surgery center.  FAILURE TO FOLLOW THESE INSTRUCTIONS MAY RESULT IN THE CANCELLATION OF YOUR SURGERY PATIENT SIGNATURE_________________________________  NURSE SIGNATURE__________________________________  ________________________________________________________________________   Amanda Taylor  An incentive spirometer is a tool that can help keep your lungs clear and active. This tool measures how well you are filling your lungs with each breath. Taking long deep breaths may help reverse or decrease the chance of developing breathing (pulmonary) problems (especially infection) following:  A long period of time when you are unable to move or be active. BEFORE THE PROCEDURE   If the spirometer includes an indicator to show your best effort, your nurse or respiratory therapist will set it to a desired goal.  If possible, sit up straight or lean slightly forward. Try not to slouch.  Hold the incentive spirometer in an upright position. INSTRUCTIONS FOR USE  1. Sit on the edge of your bed if possible, or sit up as far as you can in bed  or on a chair. 2. Hold the incentive spirometer in an upright position. 3. Breathe out normally. 4. Place the mouthpiece in your mouth and seal your lips tightly around it. 5. Breathe in slowly and as deeply as possible, raising the piston or the ball toward the top of the column. 6. Hold your breath for 3-5 seconds or for as long as possible. Allow the piston or ball to fall to the bottom of the column. 7. Remove the mouthpiece from your mouth and breathe out normally. 8. Rest for a few seconds and repeat Steps 1 through 7 at least 10 times every 1-2 hours when you are awake. Take your time and take a few normal breaths between deep breaths. 9. The spirometer may include an indicator to  show your best effort. Use the indicator as a goal to work toward during each repetition. 10. After each set of 10 deep breaths, practice coughing to be sure your lungs are clear. If you have an incision (the cut made at the time of surgery), support your incision when coughing by placing a pillow or rolled up towels firmly against it. Once you are able to get out of bed, walk around indoors and cough well. You may stop using the incentive spirometer when instructed by your caregiver.  RISKS AND COMPLICATIONS  Take your time so you do not get dizzy or light-headed.  If you are in pain, you may need to take or ask for pain medication before doing incentive spirometry. It is harder to take a deep breath if you are having pain. AFTER USE  Rest and breathe slowly and easily.  It can be helpful to keep track of a log of your progress. Your caregiver can provide you with a simple table to help with this. If you are using the spirometer at home, follow these instructions: [place] IF:   You are having difficultly using the spirometer.  You have trouble using the spirometer as often as instructed.  Your pain medication is not giving enough relief while using the spirometer.  You develop fever of 100.5  F (38.1 C) or higher. SEEK IMMEDIATE MEDICAL CARE IF:   You cough up bloody sputum that had not been present before.  You develop fever of 102 F (38.9 C) or greater.  You develop worsening pain at or near the incision site. MAKE SURE YOU:   Understand these instructions.  Will watch your condition.  Will get help right away if you are not doing well or get worse. Document Released: 03/28/2007 Document Revised: 02/07/2012 Document Reviewed: 05/29/2007 ExitCare Patient Information 2014 ExitCare, [place].   ________________________________________________________________________  WHAT IS A BLOOD TRANSFUSION? Blood Transfusion Information  A transfusion is the replacement of blood or some of its parts. Blood is made up of multiple cells which provide different functions.  Red blood cells carry oxygen and are used for blood loss replacement.  White blood cells fight against infection.  Platelets control bleeding.  Plasma helps clot blood.  Other blood products are available for specialized needs, such as hemophilia or other clotting disorders. BEFORE THE TRANSFUSION  Who gives blood for transfusions?   Healthy volunteers who are fully evaluated to make sure their blood is safe. This is blood bank blood. Transfusion therapy is the safest it has ever been in the practice of medicine. Before blood is taken from a donor, a complete history is taken to make sure that person has no history of diseases nor engages in risky social behavior (examples are intravenous drug use or sexual activity with multiple partners). The donor's travel history is screened to minimize risk of transmitting infections, such as malaria. The donated blood is tested for signs of infectious diseases, such as HIV and hepatitis. The blood is then tested to be sure it is compatible with you in order to minimize the chance of a transfusion reaction. If you or a relative donates blood, this is often done in  anticipation of surgery and is not appropriate for emergency situations. It takes many days to process the donated blood. RISKS AND COMPLICATIONS Although transfusion therapy is very safe and saves many lives, the main dangers of transfusion include:   Getting an infectious disease.  Developing a transfusion reaction. This is an allergic reaction  to something in the blood you were given. Every precaution is taken to prevent this. The decision to have a blood transfusion has been considered carefully by your caregiver before blood is given. Blood is not given unless the benefits outweigh the risks. AFTER THE TRANSFUSION  Right after receiving a blood transfusion, you will usually feel much better and more energetic. This is especially true if your red blood cells have gotten low (anemic). The transfusion raises the level of the red blood cells which carry oxygen, and this usually causes an energy increase.  The nurse administering the transfusion will monitor you carefully for complications. HOME CARE INSTRUCTIONS  No special instructions are needed after a transfusion. You may find your energy is better. Speak with your caregiver about any limitations on activity for underlying diseases you may have. SEEK MEDICAL CARE IF:   Your condition is not improving after your transfusion.  You develop redness or irritation at the intravenous (IV) site. SEEK IMMEDIATE MEDICAL CARE IF:  Any of the following symptoms occur over the next 12 hours:  Shaking chills.  You have a temperature by mouth above 102 F (38.9 C), not controlled by medicine.  Chest, back, or muscle pain.  People around you feel you are not acting correctly or are confused.  Shortness of breath or difficulty breathing.  Dizziness and fainting.  You get a rash or develop hives.  You have a decrease in urine output.  Your urine turns a dark color or changes to pink, red, or brown. Any of the following symptoms occur over  the next 10 days:  You have a temperature by mouth above 102 F (38.9 C), not controlled by medicine.  Shortness of breath.  Weakness after normal activity.  The white part of the eye turns yellow (jaundice).  You have a decrease in the amount of urine or are urinating less often.  Your urine turns a dark color or changes to pink, red, or brown. Document Released: 11/12/2000 Document Revised: 02/07/2012 Document Reviewed: 07/01/2008 [facility] Patient Information 2014 [place], [place].  _______________________________________________________________________

## 2018-02-21 NOTE — Pre-Procedure Instructions (Signed)
PCR results 02/21/2018 faxed to Dr. Charlann Boxerlin.

## 2018-02-26 MED ORDER — TRANEXAMIC ACID 1000 MG/10ML IV SOLN
1000.0000 mg | INTRAVENOUS | Status: AC
  Administered 2018-02-27: 1000 mg via INTRAVENOUS
  Filled 2018-02-26: qty 1100

## 2018-02-27 ENCOUNTER — Ambulatory Visit (HOSPITAL_COMMUNITY): Payer: BLUE CROSS/BLUE SHIELD | Admitting: Certified Registered Nurse Anesthetist

## 2018-02-27 ENCOUNTER — Observation Stay (HOSPITAL_COMMUNITY)
Admission: RE | Admit: 2018-02-27 | Discharge: 2018-02-28 | Disposition: A | Discharge status: Home / Self Care | Payer: BLUE CROSS/BLUE SHIELD | Source: Ambulatory Visit | Attending: Orthopedic Surgery | Admitting: Orthopedic Surgery

## 2018-02-27 ENCOUNTER — Encounter (HOSPITAL_COMMUNITY): Payer: Self-pay | Admitting: *Deleted

## 2018-02-27 ENCOUNTER — Other Ambulatory Visit: Payer: Self-pay

## 2018-02-27 ENCOUNTER — Encounter (HOSPITAL_COMMUNITY)
Admission: RE | Disposition: A | Discharge status: Home / Self Care | Payer: Self-pay | Source: Ambulatory Visit | Attending: Orthopedic Surgery

## 2018-02-27 DIAGNOSIS — Z791 Long term (current) use of non-steroidal anti-inflammatories (NSAID): Secondary | ICD-10-CM | POA: Diagnosis not present

## 2018-02-27 DIAGNOSIS — G8918 Other acute postprocedural pain: Secondary | ICD-10-CM | POA: Diagnosis not present

## 2018-02-27 DIAGNOSIS — M1711 Unilateral primary osteoarthritis, right knee: Principal | ICD-10-CM | POA: Insufficient documentation

## 2018-02-27 DIAGNOSIS — K5909 Other constipation: Secondary | ICD-10-CM | POA: Diagnosis not present

## 2018-02-27 DIAGNOSIS — K219 Gastro-esophageal reflux disease without esophagitis: Secondary | ICD-10-CM | POA: Insufficient documentation

## 2018-02-27 DIAGNOSIS — Z79899 Other long term (current) drug therapy: Secondary | ICD-10-CM | POA: Diagnosis not present

## 2018-02-27 DIAGNOSIS — I10 Essential (primary) hypertension: Secondary | ICD-10-CM | POA: Diagnosis not present

## 2018-02-27 DIAGNOSIS — Z96651 Presence of right artificial knee joint: Secondary | ICD-10-CM

## 2018-02-27 DIAGNOSIS — M25761 Osteophyte, right knee: Secondary | ICD-10-CM | POA: Diagnosis not present

## 2018-02-27 DIAGNOSIS — Z96659 Presence of unspecified artificial knee joint: Secondary | ICD-10-CM

## 2018-02-27 HISTORY — DX: Unilateral primary osteoarthritis, unspecified knee: M17.10

## 2018-02-27 HISTORY — PX: TOTAL KNEE ARTHROPLASTY: SHX125

## 2018-02-27 HISTORY — DX: Osteoarthritis of knee, unspecified: M17.9

## 2018-02-27 LAB — TYPE AND SCREEN
ABO/RH(D): O POS
Antibody Screen: NEGATIVE

## 2018-02-27 SURGERY — ARTHROPLASTY, KNEE, TOTAL
Anesthesia: Spinal | Site: Knee | Laterality: Right

## 2018-02-27 MED ORDER — OXYCODONE HCL 5 MG PO TABS
10.0000 mg | ORAL_TABLET | ORAL | Status: DC | PRN
  Administered 2018-02-27: 10 mg via ORAL
  Administered 2018-02-28: 20 mg via ORAL
  Administered 2018-02-28: 15 mg via ORAL
  Filled 2018-02-27: qty 4
  Filled 2018-02-27 (×2): qty 3

## 2018-02-27 MED ORDER — POLYETHYLENE GLYCOL 3350 17 G PO PACK
17.0000 g | PACK | Freq: Two times a day (BID) | ORAL | Status: DC
  Administered 2018-02-27 – 2018-02-28 (×2): 17 g via ORAL
  Filled 2018-02-27 (×2): qty 1

## 2018-02-27 MED ORDER — CYCLOBENZAPRINE HCL 10 MG PO TABS: 10.0000 mg | ORAL_TABLET | Freq: Three times a day (TID) | ORAL | 0 refills | Status: DC | PRN

## 2018-02-27 MED ORDER — METHOCARBAMOL 500 MG PO TABS
500.0000 mg | ORAL_TABLET | Freq: Four times a day (QID) | ORAL | Status: DC | PRN
  Administered 2018-02-27 – 2018-02-28 (×3): 500 mg via ORAL
  Filled 2018-02-27 (×3): qty 1

## 2018-02-27 MED ORDER — ASPIRIN 81 MG PO CHEW
81.0000 mg | CHEWABLE_TABLET | Freq: Two times a day (BID) | ORAL | Status: DC
  Administered 2018-02-27 – 2018-02-28 (×2): 81 mg via ORAL
  Filled 2018-02-27 (×2): qty 1

## 2018-02-27 MED ORDER — PROPOFOL 10 MG/ML IV BOLUS
INTRAVENOUS | Status: AC
  Filled 2018-02-27: qty 40

## 2018-02-27 MED ORDER — LIDOCAINE 2% (20 MG/ML) 5 ML SYRINGE
INTRAMUSCULAR | Status: AC
  Filled 2018-02-27: qty 5

## 2018-02-27 MED ORDER — PHENYLEPHRINE HCL 10 MG/ML IJ SOLN
INTRAVENOUS | Status: DC | PRN
  Administered 2018-02-27: 50 ug/min via INTRAVENOUS

## 2018-02-27 MED ORDER — ONDANSETRON HCL 4 MG/2ML IJ SOLN
INTRAMUSCULAR | Status: AC
  Filled 2018-02-27: qty 2

## 2018-02-27 MED ORDER — MENTHOL 3 MG MT LOZG: 1.0000 | LOZENGE | OROMUCOSAL | Status: DC | PRN

## 2018-02-27 MED ORDER — PHENYLEPHRINE 40 MCG/ML (10ML) SYRINGE FOR IV PUSH (FOR BLOOD PRESSURE SUPPORT)
PREFILLED_SYRINGE | INTRAVENOUS | Status: DC | PRN
  Administered 2018-02-27 (×3): 80 ug via INTRAVENOUS

## 2018-02-27 MED ORDER — BISACODYL 10 MG RE SUPP: 10.0000 mg | Freq: Every day | RECTAL | Status: DC | PRN

## 2018-02-27 MED ORDER — STERILE WATER FOR IRRIGATION IR SOLN
Status: DC | PRN
  Administered 2018-02-27: 2000 mL

## 2018-02-27 MED ORDER — CELECOXIB 200 MG PO CAPS
200.0000 mg | ORAL_CAPSULE | Freq: Two times a day (BID) | ORAL | Status: DC
  Administered 2018-02-27 – 2018-02-28 (×2): 200 mg via ORAL
  Filled 2018-02-27 (×2): qty 1

## 2018-02-27 MED ORDER — DEXAMETHASONE SODIUM PHOSPHATE 4 MG/ML IJ SOLN
INTRAMUSCULAR | Status: DC | PRN
  Administered 2018-02-27: 10 mg via INTRAVENOUS

## 2018-02-27 MED ORDER — OXYCODONE HCL 10 MG PO TABS: 10.0000 mg | ORAL_TABLET | ORAL | 0 refills | Status: AC | PRN

## 2018-02-27 MED ORDER — CHLORHEXIDINE GLUCONATE 4 % EX LIQD: 60.0000 mL | Freq: Once | CUTANEOUS | Status: DC

## 2018-02-27 MED ORDER — BUPIVACAINE HCL (PF) 0.25 % IJ SOLN
INTRAMUSCULAR | Status: DC | PRN
  Administered 2018-02-27: 30 mL

## 2018-02-27 MED ORDER — LIP MEDEX EX OINT
TOPICAL_OINTMENT | CUTANEOUS | Status: AC
  Administered 2018-02-27: 1
  Filled 2018-02-27: qty 7

## 2018-02-27 MED ORDER — ROPIVACAINE HCL 7.5 MG/ML IJ SOLN
INTRAMUSCULAR | Status: DC | PRN
  Administered 2018-02-27: 20 mL via PERINEURAL

## 2018-02-27 MED ORDER — POLYETHYLENE GLYCOL 3350 17 G PO PACK: 17.0000 g | PACK | Freq: Two times a day (BID) | ORAL | 0 refills | Status: DC

## 2018-02-27 MED ORDER — ALPRAZOLAM 1 MG PO TABS: 1.0000 mg | ORAL_TABLET | Freq: Two times a day (BID) | ORAL | Status: DC | PRN

## 2018-02-27 MED ORDER — LIDOCAINE 2% (20 MG/ML) 5 ML SYRINGE
INTRAMUSCULAR | Status: DC | PRN
  Administered 2018-02-27: 60 mg via INTRAVENOUS

## 2018-02-27 MED ORDER — HYDROMORPHONE HCL 1 MG/ML IJ SOLN
0.5000 mg | INTRAMUSCULAR | Status: DC | PRN
  Administered 2018-02-27: 0.5 mg via INTRAVENOUS
  Administered 2018-02-28: 1 mg via INTRAVENOUS
  Filled 2018-02-27 (×2): qty 1

## 2018-02-27 MED ORDER — ESOMEPRAZOLE MAGNESIUM 20 MG PO CPDR
20.0000 mg | DELAYED_RELEASE_CAPSULE | Freq: Every morning | ORAL | Status: DC
  Administered 2018-02-28: 20 mg via ORAL
  Filled 2018-02-27: qty 1

## 2018-02-27 MED ORDER — SODIUM CHLORIDE 0.9 % IJ SOLN
INTRAMUSCULAR | Status: DC | PRN
  Administered 2018-02-27: 30 mL

## 2018-02-27 MED ORDER — FENTANYL CITRATE (PF) 100 MCG/2ML IJ SOLN
INTRAMUSCULAR | Status: AC
  Filled 2018-02-27: qty 2

## 2018-02-27 MED ORDER — METHOCARBAMOL 1000 MG/10ML IJ SOLN
500.0000 mg | Freq: Four times a day (QID) | INTRAVENOUS | Status: DC | PRN
  Filled 2018-02-27: qty 5

## 2018-02-27 MED ORDER — DOCUSATE SODIUM 100 MG PO CAPS
100.0000 mg | ORAL_CAPSULE | Freq: Two times a day (BID) | ORAL | Status: DC
  Administered 2018-02-27 – 2018-02-28 (×2): 100 mg via ORAL
  Filled 2018-02-27 (×2): qty 1

## 2018-02-27 MED ORDER — MIDAZOLAM HCL 2 MG/2ML IJ SOLN
1.0000 mg | INTRAMUSCULAR | Status: DC
  Administered 2018-02-27: 1 mg via INTRAVENOUS
  Filled 2018-02-27: qty 2

## 2018-02-27 MED ORDER — ASPIRIN 81 MG PO CHEW: 81.0000 mg | CHEWABLE_TABLET | Freq: Two times a day (BID) | ORAL | 0 refills | Status: AC

## 2018-02-27 MED ORDER — ONDANSETRON HCL 4 MG PO TABS: 4.0000 mg | ORAL_TABLET | Freq: Four times a day (QID) | ORAL | Status: DC | PRN

## 2018-02-27 MED ORDER — PROPOFOL 10 MG/ML IV BOLUS
INTRAVENOUS | Status: AC
  Filled 2018-02-27: qty 20

## 2018-02-27 MED ORDER — ONDANSETRON HCL 4 MG/2ML IJ SOLN
INTRAMUSCULAR | Status: DC | PRN
  Administered 2018-02-27: 4 mg via INTRAVENOUS

## 2018-02-27 MED ORDER — FENTANYL CITRATE (PF) 100 MCG/2ML IJ SOLN: 25.0000 ug | INTRAMUSCULAR | Status: DC | PRN

## 2018-02-27 MED ORDER — METOCLOPRAMIDE HCL 5 MG/ML IJ SOLN: 5.0000 mg | Freq: Three times a day (TID) | INTRAMUSCULAR | Status: DC | PRN

## 2018-02-27 MED ORDER — ONDANSETRON HCL 4 MG/2ML IJ SOLN: 4.0000 mg | Freq: Four times a day (QID) | INTRAMUSCULAR | Status: DC | PRN

## 2018-02-27 MED ORDER — PROPOFOL 10 MG/ML IV BOLUS
INTRAVENOUS | Status: DC | PRN
  Administered 2018-02-27: 20 mg via INTRAVENOUS

## 2018-02-27 MED ORDER — DOCUSATE SODIUM 100 MG PO CAPS: 100.0000 mg | ORAL_CAPSULE | Freq: Two times a day (BID) | ORAL | 0 refills | Status: DC

## 2018-02-27 MED ORDER — SODIUM CHLORIDE 0.9 % IR SOLN
Status: DC | PRN
  Administered 2018-02-27: 1000 mL

## 2018-02-27 MED ORDER — 0.9 % SODIUM CHLORIDE (POUR BTL) OPTIME
TOPICAL | Status: DC | PRN
  Administered 2018-02-27: 1000 mL

## 2018-02-27 MED ORDER — KETOROLAC TROMETHAMINE 30 MG/ML IJ SOLN
INTRAMUSCULAR | Status: AC
  Filled 2018-02-27: qty 1

## 2018-02-27 MED ORDER — OXYCODONE HCL 5 MG PO TABS: 5.0000 mg | ORAL_TABLET | Freq: Once | ORAL | Status: DC | PRN

## 2018-02-27 MED ORDER — ONDANSETRON HCL 4 MG/2ML IJ SOLN
4.0000 mg | Freq: Four times a day (QID) | INTRAMUSCULAR | Status: DC | PRN
  Administered 2018-02-27: 4 mg via INTRAVENOUS
  Filled 2018-02-27: qty 2

## 2018-02-27 MED ORDER — KETOROLAC TROMETHAMINE 30 MG/ML IJ SOLN
INTRAMUSCULAR | Status: DC | PRN
  Administered 2018-02-27: 30 mg

## 2018-02-27 MED ORDER — DEXAMETHASONE SODIUM PHOSPHATE 10 MG/ML IJ SOLN
10.0000 mg | Freq: Once | INTRAMUSCULAR | Status: AC
Start: 2018-02-27 — End: 2018-02-27
  Administered 2018-02-27: 10 mg via INTRAVENOUS

## 2018-02-27 MED ORDER — PHENYLEPHRINE HCL 10 MG/ML IJ SOLN
INTRAMUSCULAR | Status: AC
  Filled 2018-02-27: qty 1

## 2018-02-27 MED ORDER — FERROUS SULFATE 325 (65 FE) MG PO TABS
325.0000 mg | ORAL_TABLET | Freq: Three times a day (TID) | ORAL | Status: DC
  Administered 2018-02-28: 325 mg via ORAL
  Filled 2018-02-27: qty 1

## 2018-02-27 MED ORDER — DIPHENHYDRAMINE HCL 12.5 MG/5ML PO ELIX: 12.5000 mg | ORAL_SOLUTION | ORAL | Status: DC | PRN

## 2018-02-27 MED ORDER — CEFAZOLIN SODIUM-DEXTROSE 2-4 GM/100ML-% IV SOLN
2.0000 g | Freq: Four times a day (QID) | INTRAVENOUS | Status: AC
  Administered 2018-02-27 – 2018-02-28 (×2): 2 g via INTRAVENOUS
  Filled 2018-02-27 (×2): qty 100

## 2018-02-27 MED ORDER — PROPOFOL 500 MG/50ML IV EMUL
INTRAVENOUS | Status: DC | PRN
  Administered 2018-02-27: 75 ug/kg/min via INTRAVENOUS

## 2018-02-27 MED ORDER — SODIUM CHLORIDE 0.9 % IJ SOLN
INTRAMUSCULAR | Status: AC
  Filled 2018-02-27: qty 50

## 2018-02-27 MED ORDER — OXYCODONE HCL 5 MG PO TABS
5.0000 mg | ORAL_TABLET | ORAL | Status: DC | PRN
  Administered 2018-02-28: 10 mg via ORAL
  Filled 2018-02-27: qty 2

## 2018-02-27 MED ORDER — ACETAMINOPHEN 500 MG PO TABS
1000.0000 mg | ORAL_TABLET | Freq: Four times a day (QID) | ORAL | Status: DC
  Administered 2018-02-27 – 2018-02-28 (×3): 1000 mg via ORAL
  Filled 2018-02-27 (×3): qty 2

## 2018-02-27 MED ORDER — PHENYLEPHRINE 40 MCG/ML (10ML) SYRINGE FOR IV PUSH (FOR BLOOD PRESSURE SUPPORT)
PREFILLED_SYRINGE | INTRAVENOUS | Status: AC
  Filled 2018-02-27: qty 10

## 2018-02-27 MED ORDER — ALUM & MAG HYDROXIDE-SIMETH 200-200-20 MG/5ML PO SUSP: 15.0000 mL | ORAL | Status: DC | PRN

## 2018-02-27 MED ORDER — METOCLOPRAMIDE HCL 5 MG PO TABS: 5.0000 mg | ORAL_TABLET | Freq: Three times a day (TID) | ORAL | Status: DC | PRN

## 2018-02-27 MED ORDER — FERROUS SULFATE 325 (65 FE) MG PO TABS: 325.0000 mg | ORAL_TABLET | Freq: Three times a day (TID) | ORAL | 3 refills | Status: DC

## 2018-02-27 MED ORDER — FLUOXETINE HCL 20 MG PO CAPS
40.0000 mg | ORAL_CAPSULE | Freq: Every morning | ORAL | Status: DC
  Administered 2018-02-28: 40 mg via ORAL
  Filled 2018-02-27: qty 2

## 2018-02-27 MED ORDER — SODIUM CHLORIDE 0.9 % IV SOLN
INTRAVENOUS | Status: DC
  Administered 2018-02-27: 21:00:00 via INTRAVENOUS

## 2018-02-27 MED ORDER — ACETAMINOPHEN 500 MG PO TABS
1000.0000 mg | ORAL_TABLET | Freq: Three times a day (TID) | ORAL | 0 refills | Status: DC
Start: 2018-02-27 — End: 2018-05-12

## 2018-02-27 MED ORDER — LACTATED RINGERS IV SOLN
INTRAVENOUS | Status: DC
  Administered 2018-02-27 (×2): via INTRAVENOUS

## 2018-02-27 MED ORDER — DEXAMETHASONE SODIUM PHOSPHATE 10 MG/ML IJ SOLN
10.0000 mg | Freq: Once | INTRAMUSCULAR | Status: AC
  Administered 2018-02-28: 10 mg via INTRAVENOUS
  Filled 2018-02-27: qty 1

## 2018-02-27 MED ORDER — MAGNESIUM CITRATE PO SOLN: 1.0000 | Freq: Once | ORAL | Status: DC | PRN

## 2018-02-27 MED ORDER — BUPIVACAINE HCL (PF) 0.25 % IJ SOLN
INTRAMUSCULAR | Status: AC
  Filled 2018-02-27: qty 30

## 2018-02-27 MED ORDER — OXYCODONE HCL 5 MG/5ML PO SOLN: 5.0000 mg | Freq: Once | ORAL | Status: DC | PRN

## 2018-02-27 MED ORDER — FENTANYL CITRATE (PF) 100 MCG/2ML IJ SOLN
50.0000 ug | INTRAMUSCULAR | Status: DC
Start: 2018-02-27 — End: 2018-02-27
  Administered 2018-02-27: 50 ug via INTRAVENOUS
  Filled 2018-02-27: qty 2

## 2018-02-27 MED ORDER — DEXAMETHASONE SODIUM PHOSPHATE 10 MG/ML IJ SOLN
INTRAMUSCULAR | Status: AC
  Filled 2018-02-27: qty 1

## 2018-02-27 MED ORDER — CEFAZOLIN SODIUM-DEXTROSE 2-4 GM/100ML-% IV SOLN
2.0000 g | INTRAVENOUS | Status: AC
  Administered 2018-02-27: 2 g via INTRAVENOUS
  Filled 2018-02-27: qty 100

## 2018-02-27 MED ORDER — TRANEXAMIC ACID 1000 MG/10ML IV SOLN
1000.0000 mg | Freq: Once | INTRAVENOUS | Status: AC
  Administered 2018-02-27: 1000 mg via INTRAVENOUS
  Filled 2018-02-27: qty 1100

## 2018-02-27 MED ORDER — TRIAMTERENE-HCTZ 75-50 MG PO TABS
1.0000 | ORAL_TABLET | Freq: Every morning | ORAL | Status: DC
  Filled 2018-02-27: qty 1

## 2018-02-27 MED ORDER — PHENOL 1.4 % MT LIQD: 1.0000 | OROMUCOSAL | Status: DC | PRN

## 2018-02-27 SURGICAL SUPPLY — 48 items
BAG DECANTER FOR FLEXI CONT (MISCELLANEOUS) IMPLANT
BAG ZIPLOCK 12X15 (MISCELLANEOUS) IMPLANT
BANDAGE ACE 6X5 VEL STRL LF (GAUZE/BANDAGES/DRESSINGS) ×2 IMPLANT
BLADE SAW SGTL 11.0X1.19X90.0M (BLADE) IMPLANT
BLADE SAW SGTL 13.0X1.19X90.0M (BLADE) ×2 IMPLANT
BOWL SMART MIX CTS (DISPOSABLE) ×2 IMPLANT
CAPT KNEE TOTAL 3 ATTUNE ×2 IMPLANT
CEMENT HV SMART SET (Cement) ×4 IMPLANT
COVER SURGICAL LIGHT HANDLE (MISCELLANEOUS) ×2 IMPLANT
CUFF TOURN SGL QUICK 34 (TOURNIQUET CUFF) ×1
CUFF TRNQT CYL 34X4X40X1 (TOURNIQUET CUFF) ×1 IMPLANT
DECANTER SPIKE VIAL GLASS SM (MISCELLANEOUS) ×2 IMPLANT
DERMABOND ADVANCED (GAUZE/BANDAGES/DRESSINGS) ×1
DERMABOND ADVANCED .7 DNX12 (GAUZE/BANDAGES/DRESSINGS) ×1 IMPLANT
DRAPE TOP 10253 STERILE (DRAPES) IMPLANT
DRAPE U-SHAPE 47X51 STRL (DRAPES) ×2 IMPLANT
DRESSING AQUACEL AG SP 3.5X10 (GAUZE/BANDAGES/DRESSINGS) ×1 IMPLANT
DRSG AQUACEL AG SP 3.5X10 (GAUZE/BANDAGES/DRESSINGS) ×2
DURAPREP 26ML APPLICATOR (WOUND CARE) ×4 IMPLANT
ELECT REM PT RETURN 15FT ADLT (MISCELLANEOUS) ×2 IMPLANT
GLOVE BIOGEL M 7.0 STRL (GLOVE) IMPLANT
GLOVE BIOGEL PI IND STRL 7.0 (GLOVE) ×5 IMPLANT
GLOVE BIOGEL PI IND STRL 7.5 (GLOVE) ×2 IMPLANT
GLOVE BIOGEL PI IND STRL 9 (GLOVE) ×2 IMPLANT
GLOVE BIOGEL PI INDICATOR 7.0 (GLOVE) ×5
GLOVE BIOGEL PI INDICATOR 7.5 (GLOVE) ×2
GLOVE BIOGEL PI INDICATOR 9 (GLOVE) ×2
GLOVE ECLIPSE 8.5 STRL (GLOVE) ×4 IMPLANT
GLOVE ORTHO TXT STRL SZ7.5 (GLOVE) ×4 IMPLANT
GOWN STRL REUS W/TWL 2XL LVL3 (GOWN DISPOSABLE) ×2 IMPLANT
GOWN STRL REUS W/TWL LRG LVL3 (GOWN DISPOSABLE) ×8 IMPLANT
HANDPIECE INTERPULSE COAX TIP (DISPOSABLE) ×1
MANIFOLD NEPTUNE II (INSTRUMENTS) ×2 IMPLANT
PACK TOTAL KNEE CUSTOM (KITS) ×2 IMPLANT
POSITIONER SURGICAL ARM (MISCELLANEOUS) ×2 IMPLANT
SET HNDPC FAN SPRY TIP SCT (DISPOSABLE) ×1 IMPLANT
SET PAD KNEE POSITIONER (MISCELLANEOUS) ×2 IMPLANT
SUT MNCRL AB 4-0 PS2 18 (SUTURE) ×2 IMPLANT
SUT STRATAFIX PDS+ 0 24IN (SUTURE) ×6 IMPLANT
SUT VIC AB 1 CT1 36 (SUTURE) ×2 IMPLANT
SUT VIC AB 2-0 CT1 27 (SUTURE) ×3
SUT VIC AB 2-0 CT1 TAPERPNT 27 (SUTURE) ×3 IMPLANT
SYR 50ML LL SCALE MARK (SYRINGE) IMPLANT
TRAY FOLEY CATH 14FRSI W/METER (CATHETERS) ×2 IMPLANT
TRAY FOLEY W/METER SILVER 16FR (SET/KITS/TRAYS/PACK) IMPLANT
WATER STERILE IRR 1000ML POUR (IV SOLUTION) ×2 IMPLANT
WRAP KNEE MAXI GEL POST OP (GAUZE/BANDAGES/DRESSINGS) ×2 IMPLANT
YANKAUER SUCT BULB TIP 10FT TU (MISCELLANEOUS) ×2 IMPLANT

## 2018-02-27 NOTE — Transfer of Care (Signed)
Immediate Anesthesia Transfer of Care Note  Patient: Amanda Taylor  Procedure(s) Performed: RIGHT TOTAL KNEE ARTHROPLASTY (Right Knee)  Patient Location: PACU  Anesthesia Type:Spinal  Level of Consciousness: awake, alert , oriented and patient cooperative  Airway & Oxygen Therapy: Patient Spontanous Breathing and Patient connected to face mask oxygen  Post-op Assessment: Report given to RN and Post -op Vital signs reviewed and stable  Post vital signs: Reviewed and stable  Last Vitals:  Vitals Value Taken Time  BP 109/69 02/27/2018  3:26 PM  Temp    Pulse 81 02/27/2018  3:28 PM  Resp 15 02/27/2018  3:28 PM  SpO2 100 % 02/27/2018  3:28 PM  Vitals shown include unvalidated device data.  Last Pain:  Vitals:   02/27/18 1301  TempSrc:   PainSc: 0-No pain      Patients Stated Pain Goal: 6 (02/27/18 1036)  Complications: No apparent anesthesia complications

## 2018-02-27 NOTE — Anesthesia Preprocedure Evaluation (Addendum)
Anesthesia Evaluation  Patient identified by MRN, date of birth, ID band Patient awake    Reviewed: Allergy & Precautions, H&P , NPO status , Patient's Chart, lab work & pertinent test results  Airway Mallampati: II   Neck ROM: full    Dental   Pulmonary    breath sounds clear to auscultation       Cardiovascular hypertension,  Rhythm:regular Rate:Normal     Neuro/Psych  Neuromuscular disease    GI/Hepatic hiatal hernia, GERD  ,  Endo/Other    Renal/GU      Musculoskeletal  (+) Arthritis ,   Abdominal   Peds  Hematology   Anesthesia Other Findings   Reproductive/Obstetrics                             Anesthesia Physical Anesthesia Plan  ASA: II  Anesthesia Plan: Spinal   Post-op Pain Management:  Regional for Post-op pain   Induction: Intravenous  PONV Risk Score and Plan: 2 and Ondansetron, Midazolam, Treatment may vary due to age or medical condition and Propofol infusion  Airway Management Planned: Nasal Cannula  Additional Equipment:   Intra-op Plan:   Post-operative Plan:   Informed Consent: I have reviewed the patients History and Physical, chart, labs and discussed the procedure including the risks, benefits and alternatives for the proposed anesthesia with the patient or authorized representative who has indicated his/her understanding and acceptance.     Plan Discussed with: CRNA, Anesthesiologist and Surgeon  Anesthesia Plan Comments:        Anesthesia Quick Evaluation

## 2018-02-27 NOTE — Discharge Instructions (Signed)

## 2018-02-27 NOTE — Progress Notes (Signed)
Assisted Dr. Chaney MallingHodierne with right, ultrasound guided, knee, adductor canal block. Side rails up, monitors on throughout procedure. See vital signs in flow sheet. Tolerated Procedure well.

## 2018-02-27 NOTE — Interval H&P Note (Signed)
History and Physical Interval Note:  02/27/2018 12:34 PM  [name]  has presented today for surgery, with the diagnosis of Right knee osteoarthritis  The various methods of treatment have been discussed with the patient and family. After consideration of risks, benefits and other options for treatment, the patient has consented to  Procedure(s) with comments: RIGHT TOTAL KNEE ARTHROPLASTY (Right) - 90 mins as a surgical intervention .  The patient's history has been reviewed, patient examined, no change in status, stable for surgery.  I have reviewed the patient's chart and labs.  Questions were answered to the patient's satisfaction.     Shelda PalMatthew D Cleven Jansma

## 2018-02-27 NOTE — Anesthesia Procedure Notes (Signed)
Anesthesia Regional Block: Adductor canal block   Pre-Anesthetic Checklist: ,, timeout performed, Correct Patient, Correct Site, Correct Laterality, Correct Procedure, Correct Position, site marked, Risks and benefits discussed,  Surgical consent,  Pre-op evaluation,  At surgeon's request and post-op pain management  Laterality: Right  Prep: chloraprep       Needles:  Injection technique: Single-shot  Needle Type: Echogenic Needle     Needle Length: 9cm  Needle Gauge: 21     Additional Needles:   Narrative:  Start time: 02/27/2018 12:01 PM End time: 02/27/2018 12:11 PM Injection made incrementally with aspirations every 5 mL.  Performed by: Personally  Anesthesiologist: Achille RichHodierne, Evett Kassa, MD  Additional Notes: Pt tolerated the procedure well.

## 2018-02-27 NOTE — Op Note (Signed)
NAME:  Amanda Taylor                      MEDICAL RECORD NO.:  [id number]                             FACILITY:  [facility]      PHYSICIAN:  Madlyn Frankel. Charlann Boxer, M.D.  DATE OF BIRTH:  May 08, 1966      DATE OF PROCEDURE:  02/27/2018                                     OPERATIVE REPORT         PREOPERATIVE DIAGNOSIS:  Right knee osteoarthritis.      POSTOPERATIVE DIAGNOSIS:  Right knee osteoarthritis.      FINDINGS:  The patient was noted to have complete loss of cartilage and   bone-on-bone arthritis with associated osteophytes in the medial and patellofemoral compartments of   the knee with a significant synovitis and associated effusion.      PROCEDURE:  Right total knee replacement.      COMPONENTS USED:  DePuy Attune rotating platform posterior stabilized knee   system, a size 4N femur, 4 tibia, size 8 mm PS AOX insert, and 35 anatomic patellar   button.      SURGEON:  Madlyn Frankel. Charlann Boxer, M.D.      ASSISTANT:  Lanney Gins, PA-C.      ANESTHESIA:  Regional and Spinal.      SPECIMENS:  None.      COMPLICATION:  None.      DRAINS:  None.  EBL: <100cc      TOURNIQUET TIME:  25 min at 250 mmHg     The patient was stable to the recovery room.      INDICATION FOR PROCEDURE:  Amanda Taylor is a 52 y.o. female patient of   mine.  The patient had been seen, evaluated, and treated conservatively in the   office with medication, activity modification, and injections.  The patient had   radiographic changes of bone-on-bone arthritis with endplate sclerosis and osteophytes noted.      The patient failed conservative measures including medication, injections, and activity modification, and at this point was ready for more definitive measures.   Based on the radiographic changes and failed conservative measures, the patient   decided to proceed with total knee replacement.  Risks of infection,   DVT, component failure, need for revision surgery, postop course, and   expectations were  all   discussed and reviewed.  Consent was obtained for benefit of pain   relief.      PROCEDURE IN DETAIL:  The patient was brought to the operative theater.   Once adequate anesthesia, preoperative antibiotics, 3 gm of Ancef, 1 gm of Tranexamic Acid, and 10 mg of Decadron administered, the patient was positioned supine with the right thigh tourniquet placed.  The  right lower extremity was prepped and draped in sterile fashion.  A time-   out was performed identifying the patient, planned procedure, and   extremity.      The right lower extremity was placed in the [facility] leg holder.  The leg was   exsanguinated, tourniquet elevated to 250 mmHg.  A midline incision was   made followed by median parapatellar arthrotomy.  Following initial   exposure, attention  was first directed to the patella.  Precut   measurement was noted to be 23 mm.  I resected down to 14 mm and used a   35 anatomic patellar button to restore patellar height as well as cover the cut   surface.      The lug holes were drilled and a metal shim was placed to protect the   patella from retractors and saw blades.      At this point, attention was now directed to the femur.  The femoral   canal was opened with a drill, irrigated to try to prevent fat emboli.  An   intramedullary rod was passed at 3 degrees valgus, 9 mm of bone was   resected off the distal femur.  Following this resection, the tibia was   subluxated anteriorly.  Using the extramedullary guide, 2 mm of bone was resected off   the proximal medial tibia.  We confirmed the gap would be   stable medially and laterally with a size 5 spacer block as well as confirmed   the cut was perpendicular in the coronal plane, checking with an alignment rod.      Once this was done, I sized the femur to be a size 4 in the anterior-   posterior dimension, chose a narrow component based on medial and   lateral dimension.  The size 4 rotation block was then pinned in    position anterior referenced using the C-clamp to set rotation.  The   anterior, posterior, and  chamfer cuts were made without difficulty nor   notching making certain that I was along the anterior cortex to help   with flexion gap stability.      The final box cut was made off the lateral aspect of distal femur.      At this point, the tibia was sized to be a size 4, the size 4 tray was   then pinned in position through the medial third of the tubercle,   drilled, and keel punched.  Trial reduction was now carried with a 4 femur,  4 tibia, a size 6 then 8 mm PS insert, and the 35 anatomic patella botton.  The knee was brought to   extension, full extension with good flexion stability with the patella   tracking through the trochlea without application of pressure.  Given   all these findings the femoral lug holes were drilled and then the trial components removed.  Final components were   opened and cement was mixed.  The knee was irrigated with normal saline   solution and pulse lavage.  The synovial lining was   then injected with 30 cc of 0.25% Marcaine with epinephrine and 1 cc of Toradol plus 30 cc of NS for a   total of 61 cc.      The knee was irrigated.  Final implants were then cemented onto clean and   dried cut surfaces of bone with the knee brought to extension with a size 8   mm PS trial insert.      Once the cement had fully cured, the excess cement was removed   throughout the knee.  I confirmed I was satisfied with the range of   motion and stability, and the final size 8 mm PS AOX insert was chosen.  It was   placed into the knee.      The tourniquet had been let down at 25 minutes.  No significant  hemostasis required.  The   extensor mechanism was then reapproximated using #1 Vicryl and #1 Stratafix sutures with the knee   in flexion.  The   remaining wound was closed with 2-0 Vicryl and running 4-0 Monocryl.   The knee was cleaned, dried, dressed sterilely  using Dermabond and   Aquacel dressing.  The patient was then   brought to recovery room in stable condition, tolerating the procedure   well.   Please note that Physician Assistant, Lanney Gins, PA-C, was present for the entirety of the case, and was utilized for pre-operative positioning, peri-operative retractor management, general facilitation of the procedure.  He was also utilized for primary wound closure at the end of the case.              Madlyn Frankel Charlann Boxer, M.D.    02/27/2018 1:10 PM

## 2018-02-28 ENCOUNTER — Encounter (HOSPITAL_COMMUNITY): Payer: Self-pay | Admitting: Orthopedic Surgery

## 2018-02-28 DIAGNOSIS — M25761 Osteophyte, right knee: Secondary | ICD-10-CM | POA: Diagnosis not present

## 2018-02-28 DIAGNOSIS — I10 Essential (primary) hypertension: Secondary | ICD-10-CM | POA: Diagnosis not present

## 2018-02-28 DIAGNOSIS — K219 Gastro-esophageal reflux disease without esophagitis: Secondary | ICD-10-CM | POA: Diagnosis not present

## 2018-02-28 DIAGNOSIS — Z96651 Presence of right artificial knee joint: Secondary | ICD-10-CM | POA: Diagnosis not present

## 2018-02-28 DIAGNOSIS — Z79899 Other long term (current) drug therapy: Secondary | ICD-10-CM | POA: Diagnosis not present

## 2018-02-28 DIAGNOSIS — M1711 Unilateral primary osteoarthritis, right knee: Secondary | ICD-10-CM | POA: Diagnosis not present

## 2018-02-28 DIAGNOSIS — Z791 Long term (current) use of non-steroidal anti-inflammatories (NSAID): Secondary | ICD-10-CM | POA: Diagnosis not present

## 2018-02-28 LAB — CBC
HCT: 27.4 % — ABNORMAL LOW (ref 36.0–46.0)
Hemoglobin: 9.3 g/dL — ABNORMAL LOW (ref 12.0–15.0)
MCH: 26.3 pg (ref 26.0–34.0)
MCHC: 33.9 g/dL (ref 30.0–36.0)
MCV: 77.6 fL — ABNORMAL LOW (ref 78.0–100.0)
Platelets: 301 10*3/uL (ref 150–400)
RBC: 3.53 MIL/uL — ABNORMAL LOW (ref 3.87–5.11)
RDW: 15.3 % (ref 11.5–15.5)
WBC: 12.4 10*3/uL — ABNORMAL HIGH (ref 4.0–10.5)

## 2018-02-28 LAB — BASIC METABOLIC PANEL
Anion gap: 5 (ref 5–15)
BUN: 15 mg/dL (ref 6–20)
CO2: 28 mmol/L (ref 22–32)
CREATININE: 1.21 mg/dL — AB (ref 0.44–1.00)
Calcium: 8.4 mg/dL — ABNORMAL LOW (ref 8.9–10.3)
Chloride: 104 mmol/L (ref 101–111)
GFR, EST AFRICAN AMERICAN: 59 mL/min — AB (ref >60)
GFR, EST NON AFRICAN AMERICAN: 51 mL/min — AB (ref >60)
Glucose, Bld: 118 mg/dL — ABNORMAL HIGH (ref 65–99)
Potassium: 3.7 mmol/L (ref 3.5–5.1)
SODIUM: 137 mmol/L (ref 135–145)

## 2018-02-28 MED ORDER — LIP MEDEX EX OINT
TOPICAL_OINTMENT | CUTANEOUS | Status: AC
  Filled 2018-02-28: qty 7

## 2018-02-28 NOTE — Progress Notes (Addendum)
Spoke with patient at bedside. Confirmed plan for OP PT, already arranged. Needs a RW and 3n1, contacted [facility] to deliver to room. [phone] 

## 2018-02-28 NOTE — Evaluation (Signed)
Physical Therapy Evaluation Patient Details Name: Amanda Taylor Nealy MRN: [id number] DOB: 02/08/1966 Today's Date: 02/28/2018   History of Present Illness  s/p R TKA  Clinical Impression  Pt is s/p TKA resulting in the deficits listed below (see PT Problem List).  Pt will benefit from skilled PT to increase their independence and safety with mobility to allow discharge to the venue listed below.      Follow Up Recommendations Follow surgeon's recommendation for DC plan and follow-up therapies;Outpatient PT    Equipment Recommendations  Other (comment)(delivered)    Recommendations for Other Services       Precautions / Restrictions Precautions Precautions: Knee Restrictions Weight Bearing Restrictions: No Other Position/Activity Restrictions: WBAT      Mobility  Bed Mobility Overal bed mobility: Needs Assistance Bed Mobility: Supine to Sit     Supine to sit: Supervision        Transfers Overall transfer level: Needs assistance Equipment used: Rolling walker (2 wheeled) Transfers: Sit to/from Stand Sit to Stand: Min guard         General transfer comment: cues for hand placement  Ambulation/Gait Ambulation/Gait assistance: Min assist Ambulation Distance (Feet): 100 Feet Assistive device: Rolling walker (2 wheeled) Gait Pattern/deviations: Step-to pattern;Step-through pattern     General Gait Details: cues for sequence initially and for gait progression/step through  Stairs            Wheelchair Mobility    Modified Rankin (Stroke Patients Only)       Balance Overall balance assessment: No apparent balance deficits (not formally assessed)                                           Pertinent Vitals/Pain Pain Assessment: 0-10 Pain Score: 3  Pain Location: right knee Pain Descriptors / Indicators: Burning;Sore;Discomfort Pain Intervention(s): Monitored during session;Limited activity within patient's tolerance;Premedicated  before session    Home Living Family/patient expects to be discharged to:: Private residence Living Arrangements: Alone Available Help at Discharge: Family;Available PRN/intermittently Type of Home: House Home Access: Stairs to enter   Secretary/administratorntrance Stairs-Number of Steps: 5+5 Home Layout: One level Home Equipment: None      Prior Function                 Hand Dominance        Extremity/Trunk Assessment   Upper Extremity Assessment Upper Extremity Assessment: Overall WFL for tasks assessed    Lower Extremity Assessment Lower Extremity Assessment: RLE deficits/detail RLE Deficits / Details: ankle WFL; knee and hip grossly 2+/5, AAROM 8* to 70*       Communication   Communication: No difficulties  Cognition Arousal/Alertness: Awake/alert Behavior During Therapy: WFL for tasks assessed/performed Overall Cognitive Status: Within Functional Limits for tasks assessed                                        General Comments      Exercises Total Joint Exercises Ankle Circles/Pumps: AROM;Both;10 reps Quad Sets: AROM;Both;10 reps Heel Slides: AROM;AAROM;Right;10 reps Hip ABduction/ADduction: AROM;Both;15 reps Straight Leg Raises: AROM;Right;10 reps Long Arc Quad: AROM;Right;10 reps;Seated   Assessment/Plan    PT Assessment Patient needs continued PT services  PT Problem List Decreased strength;Decreased range of motion;Decreased activity tolerance;Decreased balance;Decreased mobility;Pain;Decreased knowledge of use of DME  PT Treatment Interventions Stair training;DME instruction;Gait training;Therapeutic activities;Therapeutic exercise;Functional mobility training;Patient/family education    PT Goals (Current goals can be found in the Care Plan section)  Acute Rehab PT Goals Patient Stated Goal:  get back to life with less knee pain PT Goal Formulation: With patient Time For Goal Achievement: 03/07/18 Potential to Achieve Goals: Good     Frequency 7X/week   Barriers to discharge        Co-evaluation               AM-PAC PT "6 Clicks" Daily Activity  Outcome Measure Difficulty turning over in bed (including adjusting bedclothes, sheets and blankets)?: A Little Difficulty moving from lying on back to sitting on the side of the bed? : A Little Difficulty sitting down on and standing up from a chair with arms (e.g., wheelchair, bedside commode, etc,.)?: A Little Help needed moving to and from a bed to chair (including a wheelchair)?: A Little Help needed walking in hospital room?: A Little Help needed climbing 3-5 steps with a railing? : A Little 6 Click Score: 18    End of Session Equipment Utilized During Treatment: Gait belt Activity Tolerance: Patient tolerated treatment well Patient left: in chair;with call bell/phone within reach;with family/visitor present;with chair alarm set Nurse Communication: Mobility status PT Visit Diagnosis: Unsteadiness on feet (R26.81)    Time: 4098-1191 PT Time Calculation (min) (ACUTE ONLY): 33 min   Charges:   PT Evaluation $PT Eval Low Complexity: 1 Low PT Treatments $Gait Training: 8-22 mins   PT G CodesDrucilla Chalet, PT Pager: [phone] 02/28/2018   Drucilla Chalet 02/28/2018, 12:21 PM

## 2018-02-28 NOTE — Progress Notes (Signed)
Patient ID: Etta Grandchildvelyn M Timm, female   DOB: [birth year], 52 y.o.   MRN: [id number] Subjective: 1 Day Post-Op Procedure(s) (LRB): RIGHT TOTAL KNEE ARTHROPLASTY (Right)    Patient reports pain as moderate. No events.doing well this am  Objective:   VITALS:   Vitals:   02/28/18 0215 02/28/18 0625  BP: 109/68 100/74  Pulse: 81 86  Resp: 18 16  Temp: 97.8 F (36.6 C) 97.8 F (36.6 C)  SpO2: 98% 99%    Neurovascular intact Incision: dressing C/D/I  LABS Recent Labs    02/28/18 0539  HGB 9.3*  HCT 27.4*  WBC 12.4*  PLT 301    Recent Labs    02/28/18 0539  NA 137  K 3.7  BUN 15  CREATININE 1.21*  GLUCOSE 118*    No results for input(s): LABPT, INR in the last 72 hours.   Assessment/Plan: 1 Day Post-Op Procedure(s) (LRB): RIGHT TOTAL KNEE ARTHROPLASTY (Right)   Advance diet Up with therapy  Reviewed goals RTC in 2 weeks

## 2018-02-28 NOTE — Progress Notes (Signed)
           PT  Treatment NOTE    02/28/18 1227  PT Visit Information  Assistance Needed +1  History of Present Illness s/p R TKA  Pt making excellent progress, feels ready for d/c. RN  made aware  Subjective Data  Patient Stated Goal  get back to life with less knee pain  Precautions  Precautions Knee  Precaution Comments reviewed no pillow under knee  Restrictions  Weight Bearing Restrictions No  Other Position/Activity Restrictions WBAT  Pain Assessment  Pain Location right knee  Pain Descriptors / Indicators Burning;Sore;Discomfort  Cognition  Arousal/Alertness Awake/alert  Behavior During Therapy WFL for tasks assessed/performed  Overall Cognitive Status Within Functional Limits for tasks assessed  Bed Mobility  General bed mobility comments in chair  Transfers  Overall transfer level Needs assistance  Equipment used Rolling walker (2 wheeled)  Transfers Sit to/from Stand  Sit to Stand Min guard;Supervision  General transfer comment cues for hand placement  Ambulation/Gait  Ambulation/Gait assistance Supervision  Assistive device Rolling walker (2 wheeled)  Gait Pattern/deviations Step-to pattern;Step-through pattern  General Gait Details cues for safety with turns  Stairs assistance Min guard  Stair Management One rail Left;Step to pattern;Forwards;With crutches  General stair comments cues for sequence and safety  Balance  Overall balance assessment No apparent balance deficits (not formally assessed)  Total Joint Exercises  Long Arc Quad  (reviewed HEP, pt to perform again later this pm after d/c)  PT - End of Session  Equipment Utilized During Treatment Gait belt  Activity Tolerance Patient tolerated treatment well  Patient left in chair;with call bell/phone within reach;with family/visitor present;with chair alarm set  Nurse Communication Mobility status   PT - Assessment/Plan  PT Plan Current plan remains appropriate  PT Visit Diagnosis Unsteadiness on  feet (R26.81)  PT Frequency (ACUTE ONLY) 7X/week  Follow Up Recommendations Follow surgeon's recommendation for DC plan and follow-up therapies;Outpatient PT  PT equipment Other (comment)  AM-PAC PT "6 Clicks" Daily Activity Outcome Measure  Difficulty turning over in bed (including adjusting bedclothes, sheets and blankets)? 3  Difficulty moving from lying on back to sitting on the side of the bed?  3  Difficulty sitting down on and standing up from a chair with arms (e.g., wheelchair, bedside commode, etc,.)? 3  Help needed moving to and from a bed to chair (including a wheelchair)? 3  Help needed walking in hospital room? 3  Help needed climbing 3-5 steps with a railing?  3  6 Click Score 18  Mobility G Code  CK  Acute Rehab PT Goals  PT Goal Formulation With patient  Time For Goal Achievement 03/07/18  Potential to Achieve Goals Good  PT Time Calculation  PT Start Time (ACUTE ONLY) 1147  PT Stop Time (ACUTE ONLY) 1210  PT Time Calculation (min) (ACUTE ONLY) 23 min  PT General Charges  $$ ACUTE PT VISIT 1 Visit  PT Treatments  $Gait Training 23-37 mins

## 2018-02-28 NOTE — Anesthesia Postprocedure Evaluation (Signed)
Anesthesia Post Note  Patient: Amanda Taylor  Procedure(s) Performed: RIGHT TOTAL KNEE ARTHROPLASTY (Right Knee)     Patient location during evaluation: PACU Anesthesia Type: Spinal Level of consciousness: oriented and awake and alert Pain management: pain level controlled Vital Signs Assessment: post-procedure vital signs reviewed and stable Respiratory status: spontaneous breathing, respiratory function stable and patient connected to nasal cannula oxygen Cardiovascular status: blood pressure returned to baseline and stable Postop Assessment: no headache, no backache and no apparent nausea or vomiting Anesthetic complications: no    Last Vitals:  Vitals:   02/28/18 0625 02/28/18 1018  BP: 100/74 102/74  Pulse: 86 82  Resp: 16 17  Temp: 36.6 C 36.6 C  SpO2: 99% 99%    Last Pain:  Vitals:   02/28/18 1056  TempSrc:   PainSc: 8                  Beverlie Kurihara S

## 2018-03-02 DIAGNOSIS — M1711 Unilateral primary osteoarthritis, right knee: Secondary | ICD-10-CM | POA: Diagnosis not present

## 2018-03-02 NOTE — Discharge Summary (Signed)
Physician Discharge Summary  Patient ID: Amanda Grandchildvelyn M Langland MRN: [id number] DOB/AGE: 08-21-1966 52 y.o.  Admit date: 02/27/2018 Discharge date: 02/28/2018   Procedures:  Procedure(s) (LRB): RIGHT TOTAL KNEE ARTHROPLASTY (Right)  Attending Physician:  Dr. Durene RomansMatthew Olin   Admission Diagnoses:   Right knee primary OA / pain  Discharge Diagnoses:  Principal Problem:   S/P right TKA Active Problems:   S/P total knee replacement  Past Medical History:  Diagnosis Date  . Chronic constipation   . Chronic low back pain    mobic---- numbnes down right leg from hip occasionally  . Family history of adverse reaction to anesthesia    mother-- ponv  . GERD (gastroesophageal reflux disease)   . Hiatal hernia   . Hypertension   . Osteoarthritis of knee    right and left  . Seasonal allergies     HPI:    Amanda Taylor, 52 y.o. female, has a history of pain and functional disability in the right knee due to arthritis and has failed non-surgical conservative treatments for greater than 12 weeks to includeNSAID's and/or analgesics, corticosteriod injections, viscosupplementation injections and activity modification.  Onset of symptoms was gradual, starting 3-4 years ago with gradually worsening course since that time. The patient noted no past surgery on the right knee(s).  Patient currently rates pain in the right knee(s) at 10 out of 10 with activity. Patient has night pain, worsening of pain with activity and weight bearing, pain that interferes with activities of daily living, pain with passive range of motion, crepitus and joint swelling.  Patient has evidence of periarticular osteophytes and joint space narrowing by imaging studies.  There is no active infection.  Risks, benefits and expectations were discussed with the patient.  Risks including but not limited to the risk of anesthesia, blood clots, nerve damage, blood vessel damage, failure of the prosthesis, infection and up to and including  death.  Patient understand the risks, benefits and expectations and wishes to proceed with surgery.   PCP: Toma DeitersHasanaj, Xaje A, MD   Discharged Condition: good  Hospital Course:  Patient underwent the above stated procedure on 02/27/2018. Patient tolerated the procedure well and brought to the recovery room in good condition and subsequently to the floor.  POD #1 BP: 100/74 ; Pulse: 86 ; Temp: 97.8 F (36.6 C) ; Resp: 16 Patient reports pain as moderate. No events.doing well this am. Neurovascular intact and incision: dressing C/D/I.   LABS  Basename    HGB     9.3  HCT     27.4    Discharge Exam: General appearance: alert, cooperative and no distress Extremities: Homans sign is negative, no sign of DVT, no edema, redness or tenderness in the calves or thighs and no ulcers, gangrene or trophic changes  Disposition:  Home with follow up in 2 weeks   Follow-up Information    Durene Romanslin, Jarelly Rinck, MD. Schedule an appointment as soon as possible for a visit in 2 weeks.   Specialty:  Orthopedic Surgery Contact information: [address] [place] [address] [phone]        [facility] - Dme Follow up.   Why:  3n1 and walker Contact information: [address] [place] [address] [phone]           Discharge Instructions    Call MD / Call 911   Complete by:  As directed    If you experience chest pain or shortness of breath,  CALL 911 and be transported to the hospital emergency room.  If you develope a fever above 101 F, pus (white drainage) or increased drainage or redness at the wound, or calf pain, call your surgeon's office.   Change dressing   Complete by:  As directed    Maintain surgical dressing until follow up in the clinic. If the edges start to pull up, may reinforce with tape. If the dressing is no longer working, may remove and cover with gauze and tape, but must keep the area dry and clean.  Call with any questions or  concerns.   Constipation Prevention   Complete by:  As directed    Drink plenty of fluids.  Prune juice may be helpful.  You may use a stool softener, such as Colace (over the counter) 100 mg twice a day.  Use MiraLax (over the counter) for constipation as needed.   Diet - low sodium heart healthy   Complete by:  As directed    Discharge instructions   Complete by:  As directed    Maintain surgical dressing until follow up in the clinic. If the edges start to pull up, may reinforce with tape. If the dressing is no longer working, may remove and cover with gauze and tape, but must keep the area dry and clean.  Follow up in 2 weeks at [facility]. Call with any questions or concerns.   Increase activity slowly as tolerated   Complete by:  As directed    Weight bearing as tolerated with assist device (walker, cane, etc) as directed, use it as long as suggested by your surgeon or therapist, typically at least 4-6 weeks.   TED hose   Complete by:  As directed    Use stockings (TED hose) for 2 weeks on both leg(s).  You may remove them at night for sleeping.      Allergies as of 02/28/2018      Reactions   Elavil [amitriptyline] Other (See Comments)   Urine retention   Topamax [topiramate] Other (See Comments)   Numbness in fingertips      Medication List    STOP taking these medications   meloxicam 15 MG tablet Commonly known as:  MOBIC   oxyCODONE-acetaminophen 10-325 MG tablet Commonly known as:  PERCOCET     TAKE these medications   acetaminophen 500 MG tablet Commonly known as:  TYLENOL Take 2 tablets (1,000 mg total) by mouth every 8 (eight) hours.   ALPRAZolam 1 MG tablet Commonly known as:  XANAX Take 1 mg by mouth 2 (two) times daily as needed for anxiety.   aspirin 81 MG chewable tablet Commonly known as:  ASPIRIN CHILDRENS Chew 1 tablet (81 mg total) by mouth 2 (two) times daily. Take for 4 weeks, then resume regular dose.   BIOTIN MAXIMUM PO Take 1  capsule by mouth daily.   cholecalciferol 400 units Tabs tablet Commonly known as:  VITAMIN D Take 400 Units by mouth.   cyclobenzaprine 10 MG tablet Commonly known as:  FLEXERIL Take 1 tablet (10 mg total) by mouth 3 (three) times daily as needed for muscle spasms. What changed:    how much to take  how to take this  when to take this  reasons to take this   docusate sodium 100 MG capsule Commonly known as:  COLACE Take 1 capsule (100 mg total) by mouth 2 (two) times daily. What changed:    when to take this  reasons to take  this   Esomeprazole Magnesium 20 MG Tbec Take 20 mg by mouth every morning.   ferrous sulfate 325 (65 FE) MG tablet Commonly known as:  FERROUSUL Take 1 tablet (325 mg total) by mouth 3 (three) times daily with meals.   FLUoxetine 40 MG capsule Commonly known as:  PROZAC Take 40 mg by mouth every morning.   Oxycodone HCl 10 MG Tabs Take 1-2 tablets (10-20 mg total) by mouth every 4 (four) hours as needed for up to 5 days.   polyethylene glycol packet Commonly known as:  MIRALAX / GLYCOLAX Take 17 g by mouth 2 (two) times daily.   promethazine 25 MG tablet Commonly known as:  PHENERGAN Take 25 mg by mouth every 6 (six) hours as needed.   triamterene-hydrochlorothiazide 75-50 MG tablet Commonly known as:  MAXZIDE Take 1 tablet by mouth every morning.            Discharge Care Instructions  (From admission, onward)        Start     Ordered   02/28/18 0000  Change dressing    Comments:  Maintain surgical dressing until follow up in the clinic. If the edges start to pull up, may reinforce with tape. If the dressing is no longer working, may remove and cover with gauze and tape, but must keep the area dry and clean.  Call with any questions or concerns.   02/28/18 1610       Signed: Anastasio Auerbach. Amora Sheehy   PA-C  03/02/2018, 8:20 AM

## 2018-03-06 DIAGNOSIS — M1711 Unilateral primary osteoarthritis, right knee: Secondary | ICD-10-CM | POA: Diagnosis not present

## 2018-03-08 DIAGNOSIS — M1711 Unilateral primary osteoarthritis, right knee: Secondary | ICD-10-CM | POA: Diagnosis not present

## 2018-03-13 ENCOUNTER — Other Ambulatory Visit (HOSPITAL_COMMUNITY): Payer: Self-pay | Admitting: Orthopedic Surgery

## 2018-03-13 DIAGNOSIS — R52 Pain, unspecified: Secondary | ICD-10-CM

## 2018-03-13 DIAGNOSIS — R609 Edema, unspecified: Secondary | ICD-10-CM

## 2018-03-14 ENCOUNTER — Ambulatory Visit (HOSPITAL_COMMUNITY)
Admission: RE | Admit: 2018-03-14 | Discharge: 2018-03-14 | Disposition: A | Discharge status: Home / Self Care | Payer: BLUE CROSS/BLUE SHIELD | Source: Ambulatory Visit | Attending: Orthopedic Surgery | Admitting: Orthopedic Surgery

## 2018-03-14 DIAGNOSIS — M79661 Pain in right lower leg: Secondary | ICD-10-CM | POA: Diagnosis not present

## 2018-03-14 DIAGNOSIS — M79605 Pain in left leg: Secondary | ICD-10-CM | POA: Insufficient documentation

## 2018-03-14 DIAGNOSIS — R52 Pain, unspecified: Secondary | ICD-10-CM

## 2018-03-14 DIAGNOSIS — R609 Edema, unspecified: Secondary | ICD-10-CM

## 2018-03-14 DIAGNOSIS — M79604 Pain in right leg: Secondary | ICD-10-CM | POA: Insufficient documentation

## 2018-03-14 DIAGNOSIS — M79662 Pain in left lower leg: Secondary | ICD-10-CM | POA: Insufficient documentation

## 2018-03-14 DIAGNOSIS — M7989 Other specified soft tissue disorders: Secondary | ICD-10-CM | POA: Diagnosis not present

## 2018-03-15 ENCOUNTER — Ambulatory Visit: Payer: BLUE CROSS/BLUE SHIELD | Admitting: Obstetrics and Gynecology

## 2018-03-15 DIAGNOSIS — M1711 Unilateral primary osteoarthritis, right knee: Secondary | ICD-10-CM | POA: Diagnosis not present

## 2018-03-16 DIAGNOSIS — M1711 Unilateral primary osteoarthritis, right knee: Secondary | ICD-10-CM | POA: Diagnosis not present

## 2018-03-21 DIAGNOSIS — M1711 Unilateral primary osteoarthritis, right knee: Secondary | ICD-10-CM | POA: Diagnosis not present

## 2018-03-22 DIAGNOSIS — M1711 Unilateral primary osteoarthritis, right knee: Secondary | ICD-10-CM | POA: Diagnosis not present

## 2018-03-28 DIAGNOSIS — M1711 Unilateral primary osteoarthritis, right knee: Secondary | ICD-10-CM | POA: Diagnosis not present

## 2018-03-29 DIAGNOSIS — Z79891 Long term (current) use of opiate analgesic: Secondary | ICD-10-CM | POA: Diagnosis not present

## 2018-03-30 DIAGNOSIS — M1711 Unilateral primary osteoarthritis, right knee: Secondary | ICD-10-CM | POA: Diagnosis not present

## 2018-04-05 DIAGNOSIS — M1711 Unilateral primary osteoarthritis, right knee: Secondary | ICD-10-CM | POA: Diagnosis not present

## 2018-04-07 DIAGNOSIS — M1711 Unilateral primary osteoarthritis, right knee: Secondary | ICD-10-CM | POA: Diagnosis not present

## 2018-04-10 DIAGNOSIS — M1711 Unilateral primary osteoarthritis, right knee: Secondary | ICD-10-CM | POA: Diagnosis not present

## 2018-04-12 DIAGNOSIS — M1711 Unilateral primary osteoarthritis, right knee: Secondary | ICD-10-CM | POA: Diagnosis not present

## 2018-04-14 DIAGNOSIS — Z4789 Encounter for other orthopedic aftercare: Secondary | ICD-10-CM | POA: Diagnosis not present

## 2018-04-17 DIAGNOSIS — M1711 Unilateral primary osteoarthritis, right knee: Secondary | ICD-10-CM | POA: Diagnosis not present

## 2018-04-20 DIAGNOSIS — M792 Neuralgia and neuritis, unspecified: Secondary | ICD-10-CM | POA: Diagnosis not present

## 2018-04-20 DIAGNOSIS — Z79891 Long term (current) use of opiate analgesic: Secondary | ICD-10-CM | POA: Diagnosis not present

## 2018-04-20 DIAGNOSIS — M5416 Radiculopathy, lumbar region: Secondary | ICD-10-CM | POA: Diagnosis not present

## 2018-04-20 DIAGNOSIS — G8929 Other chronic pain: Secondary | ICD-10-CM | POA: Diagnosis not present

## 2018-04-26 DIAGNOSIS — M1711 Unilateral primary osteoarthritis, right knee: Secondary | ICD-10-CM | POA: Diagnosis not present

## 2018-05-05 NOTE — Progress Notes (Signed)
Need orders in peic for 5-13 surgery pre op is 6-10 1100 am

## 2018-05-05 NOTE — Patient Instructions (Addendum)
Amanda Taylor  05/05/2018   Your procedure is scheduled on: 05-11-18   Report to [facility] Main  Entrance    Report to Admitting at 7:30 AM    Call this number if you have problems the morning of surgery [phone]   Remember: Do not eat food or drink liquids :After Midnight.     Take these medicines the morning of surgery with A SIP OF WATER: Esomeprazole Magnesium (Nexium), Fluoxetine (Prozac), and Alprazolam if needed.                                You may not have any metal on your body including hair pins and              piercings  Do not wear jewelry, make-up, lotions, powders or perfumes, deodorant             Do not wear nail polish.  Do not shave  48 hours prior to surgery.                Do not bring valuables to the hospital. [facility] IS NOT             RESPONSIBLE   FOR VALUABLES.  Contacts, dentures or bridgework may not be worn into surgery.  Leave suitcase in the car. After surgery it may be brought to your room.     Special Instructions: N/A              Please read over the following fact sheets you were given: _____________________________________________________________________             [facility] - Preparing for Surgery Before surgery, you can play an important role.  Because skin is not sterile, your skin needs to be as free of germs as possible.  You can reduce the number of germs on your skin by washing with CHG (chlorahexidine gluconate) soap before surgery.  CHG is an antiseptic cleaner which kills germs and bonds with the skin to continue killing germs even after washing. Please DO NOT use if you have an allergy to CHG or antibacterial soaps.  If your skin becomes reddened/irritated stop using the CHG and inform your nurse when you arrive at Short Stay. Do not shave (including legs and underarms) for at least 48 hours prior to the first CHG shower.  You may shave your face/neck. Please follow these instructions  carefully:  1.  Shower with CHG Soap the night before surgery and the  morning of Surgery.  2.  If you choose to wash your hair, wash your hair first as usual with your  normal  shampoo.  3.  After you shampoo, rinse your hair and body thoroughly to remove the  shampoo.                           4.  Use CHG as you would any other liquid soap.  You can apply chg directly  to the skin and wash                       Gently with a scrungie or clean washcloth.  5.  Apply the CHG Soap to your body ONLY FROM THE NECK DOWN.   Do not use on face/ open  Wound or open sores. Avoid contact with eyes, ears mouth and genitals (private parts).                       Wash face,  Genitals (private parts) with your normal soap.             6.  Wash thoroughly, paying special attention to the area where your surgery  will be performed.  7.  Thoroughly rinse your body with warm water from the neck down.  8.  DO NOT shower/wash with your normal soap after using and rinsing off  the CHG Soap.                9.  Pat yourself dry with a clean towel.            10.  Wear clean pajamas.            11.  Place clean sheets on your bed the night of your first shower and do not  sleep with pets. Day of Surgery : Do not apply any lotions/deodorants the morning of surgery.  Please wear clean clothes to the hospital/surgery center.  FAILURE TO FOLLOW THESE INSTRUCTIONS MAY RESULT IN THE CANCELLATION OF YOUR SURGERY PATIENT SIGNATURE_________________________________  NURSE SIGNATURE__________________________________  ________________________________________________________________________    WHAT IS A BLOOD TRANSFUSION? Blood Transfusion Information  A transfusion is the replacement of blood or some of its parts. Blood is made up of multiple cells which provide different functions.  Red blood cells carry oxygen and are used for blood loss replacement.  White blood cells fight against  infection.  Platelets control bleeding.  Plasma helps clot blood.  Other blood products are available for specialized needs, such as hemophilia or other clotting disorders. BEFORE THE TRANSFUSION  Who gives blood for transfusions?   Healthy volunteers who are fully evaluated to make sure their blood is safe. This is blood bank blood. Transfusion therapy is the safest it has ever been in the practice of medicine. Before blood is taken from a donor, a complete history is taken to make sure that person has no history of diseases nor engages in risky social behavior (examples are intravenous drug use or sexual activity with multiple partners). The donor's travel history is screened to minimize risk of transmitting infections, such as malaria. The donated blood is tested for signs of infectious diseases, such as HIV and hepatitis. The blood is then tested to be sure it is compatible with you in order to minimize the chance of a transfusion reaction. If you or a relative donates blood, this is often done in anticipation of surgery and is not appropriate for emergency situations. It takes many days to process the donated blood. RISKS AND COMPLICATIONS Although transfusion therapy is very safe and saves many lives, the main dangers of transfusion include:   Getting an infectious disease.  Developing a transfusion reaction. This is an allergic reaction to something in the blood you were given. Every precaution is taken to prevent this. The decision to have a blood transfusion has been considered carefully by your caregiver before blood is given. Blood is not given unless the benefits outweigh the risks. AFTER THE TRANSFUSION  Right after receiving a blood transfusion, you will usually feel much better and more energetic. This is especially true if your red blood cells have gotten low (anemic). The transfusion raises the level of the red blood cells which carry oxygen, and this usually causes an energy  increase.  The nurse administering the transfusion will monitor you carefully for complications. HOME CARE INSTRUCTIONS  No special instructions are needed after a transfusion. You may find your energy is better. Speak with your caregiver about any limitations on activity for underlying diseases you may have. SEEK MEDICAL CARE IF:   Your condition is not improving after your transfusion.  You develop redness or irritation at the intravenous (IV) site. SEEK IMMEDIATE MEDICAL CARE IF:  Any of the following symptoms occur over the next 12 hours:  Shaking chills.  You have a temperature by mouth above 102 F (38.9 C), not controlled by medicine.  Chest, back, or muscle pain.  People around you feel you are not acting correctly or are confused.  Shortness of breath or difficulty breathing.  Dizziness and fainting.  You get a rash or develop hives.  You have a decrease in urine output.  Your urine turns a dark color or changes to pink, red, or brown. Any of the following symptoms occur over the next 10 days:  You have a temperature by mouth above 102 F (38.9 C), not controlled by medicine.  Shortness of breath.  Weakness after normal activity.  The white part of the eye turns yellow (jaundice).  You have a decrease in the amount of urine or are urinating less often.  Your urine turns a dark color or changes to pink, red, or brown. Document Released: 11/12/2000 Document Revised: 02/07/2012 Document Reviewed: 07/01/2008 [employer] Patient Information 2014 [place], [place].  _______________________________________________________________________  Incentive Spirometer  An incentive spirometer is a tool that can help keep your lungs clear and active. This tool measures how well you are filling your lungs with each breath. Taking long deep breaths may help reverse or decrease the chance of developing breathing (pulmonary) problems (especially infection) following:  A long  period of time when you are unable to move or be active. BEFORE THE PROCEDURE   If the spirometer includes an indicator to show your best effort, your nurse or respiratory therapist will set it to a desired goal.  If possible, sit up straight or lean slightly forward. Try not to slouch.  Hold the incentive spirometer in an upright position. INSTRUCTIONS FOR USE  1. Sit on the edge of your bed if possible, or sit up as far as you can in bed or on a chair. 2. Hold the incentive spirometer in an upright position. 3. Breathe out normally. 4. Place the mouthpiece in your mouth and seal your lips tightly around it. 5. Breathe in slowly and as deeply as possible, raising the piston or the ball toward the top of the column. 6. Hold your breath for 3-5 seconds or for as long as possible. Allow the piston or ball to fall to the bottom of the column. 7. Remove the mouthpiece from your mouth and breathe out normally. 8. Rest for a few seconds and repeat Steps 1 through 7 at least 10 times every 1-2 hours when you are awake. Take your time and take a few normal breaths between deep breaths. 9. The spirometer may include an indicator to show your best effort. Use the indicator as a goal to work toward during each repetition. 10. After each set of 10 deep breaths, practice coughing to be sure your lungs are clear. If you have an incision (the cut made at the time of surgery), support your incision when coughing by placing a pillow or rolled up towels firmly against it. Once you are able  to get out of bed, walk around indoors and cough well. You may stop using the incentive spirometer when instructed by your caregiver.  RISKS AND COMPLICATIONS  Take your time so you do not get dizzy or light-headed.  If you are in pain, you may need to take or ask for pain medication before doing incentive spirometry. It is harder to take a deep breath if you are having pain. AFTER USE  Rest and breathe slowly and  easily.  It can be helpful to keep track of a log of your progress. Your caregiver can provide you with a simple table to help with this. If you are using the spirometer at home, follow these instructions: SEEK MEDICAL CARE IF:   You are having difficultly using the spirometer.  You have trouble using the spirometer as often as instructed.  Your pain medication is not giving enough relief while using the spirometer.  You develop fever of 100.5 F (38.1 C) or higher. SEEK IMMEDIATE MEDICAL CARE IF:   You cough up bloody sputum that had not been present before.  You develop fever of 102 F (38.9 C) or greater.  You develop worsening pain at or near the incision site. MAKE SURE YOU:   Understand these instructions.  Will watch your condition.  Will get help right away if you are not doing well or get worse. Document Released: 03/28/2007 Document Revised: 02/07/2012 Document Reviewed: 05/29/2007 [facility] Patient Information 2014 [place], [place].   ________________________________________________________________________

## 2018-05-05 NOTE — Progress Notes (Signed)
02-21-18 (Epic) EKG

## 2018-05-08 ENCOUNTER — Inpatient Hospital Stay (HOSPITAL_COMMUNITY)
Admission: RE | Admit: 2018-05-08 | Discharge: 2018-05-08 | Disposition: A | Discharge status: Home / Self Care | Payer: BLUE CROSS/BLUE SHIELD | Source: Ambulatory Visit

## 2018-05-08 NOTE — H&P (Signed)
TOTAL KNEE ADMISSION H&P  Patient is being admitted for left total knee arthroplasty.  Subjective:  Chief Complaint:   Left knee primary OA / pain  HPI: Amanda Taylor, 52 y.o. female, has a history of pain and functional disability in the left knee due to arthritis and has failed non-surgical conservative treatments for greater than 12 weeks to include NSAID's and/or analgesics, corticosteriod injections and activity modification.  Onset of symptoms was gradual, starting 1+ years ago with gradually worsening course since that time. The patient noted prior procedures on the knee to include  arthroplasty on the right knee per Dr. Charlann Boxer on  02/27/2018.  Patient currently rates pain in the left knee(s) at 10 out of 10 with activity. Patient has night pain, worsening of pain with activity and weight bearing, pain that interferes with activities of daily living, pain with passive range of motion, crepitus and joint swelling.  Patient has evidence of periarticular osteophytes and joint space narrowing by imaging studies.  There is no active infection.  Risks, benefits and expectations were discussed with the patient.  Risks including but not limited to the risk of anesthesia, blood clots, nerve damage, blood vessel damage, failure of the prosthesis, infection and up to and including death.  Patient understand the risks, benefits and expectations and wishes to proceed with surgery.   PCP: Toma Deiters, MD  D/C Plans:       Home   Post-op Meds:       No Rx given   Tranexamic Acid:      To be given - IV   Decadron:      Is to be given  FYI:      ASA  Oxycodone  DME:   Pt already has equipment   PT:   OPPT Rx given   Patient Active Problem List   Diagnosis Date Noted  . S/P right TKA 02/27/2018  . S/P total knee replacement 02/27/2018  . Class 2 obesity without serious comorbidity with body mass index (BMI) of 39.0 to 39.9 in adult 02/10/2018   Past Medical History:  Diagnosis Date  .  Chronic constipation   . Chronic low back pain    mobic---- numbnes down right leg from hip occasionally  . Family history of adverse reaction to anesthesia    mother-- ponv  . GERD (gastroesophageal reflux disease)   . Hiatal hernia   . Hypertension   . Osteoarthritis of knee    right and left  . Seasonal allergies     Past Surgical History:  Procedure Laterality Date  . COLONOSCOPY    . DILITATION & CURRETTAGE/HYSTROSCOPY WITH NOVASURE ABLATION N/A 06/15/2013   Procedure: DILATATION & CURETTAGE/HYSTEROSCOPY WITH NOVASURE ABLATION;  Surgeon: Lenoard Aden, MD;  Location: [facility];  Service: Gynecology;  Laterality: N/A;  . ESOPHAGOGASTRODUODENOSCOPY    . LAPAROSCOPIC NISSEN FUNDOPLICATION  2007  . TOE SURGERY Bilateral 1996   bilateral 5th toe bones removed  . TOTAL KNEE ARTHROPLASTY Right 02/27/2018   Procedure: RIGHT TOTAL KNEE ARTHROPLASTY;  Surgeon: Durene Romans, MD;  Location: [facility];  Service: Orthopedics;  Laterality: Right;  90 mins  . TUBAL LIGATION Bilateral 1989    No current facility-administered medications for this encounter.    Current Outpatient Medications  Medication Sig Dispense Refill Last Dose  . ALPRAZolam (XANAX) 1 MG tablet Take 1 mg by mouth 2 (two) times daily as needed for anxiety.   2 Past Month at Unknown time  . aspirin EC  81 MG tablet Take 81 mg by mouth daily.     . cyclobenzaprine (FLEXERIL) 10 MG tablet Take 1 tablet (10 mg total) by mouth 3 (three) times daily as needed for muscle spasms. 30 tablet 0   . diphenhydrAMINE HCl, Sleep, (ZZZQUIL) 25 MG CAPS Take 25-50 mg by mouth at bedtime as needed (sleep).     . Esomeprazole Magnesium 20 MG TBEC Take 20 mg by mouth every morning.    Past Month at Unknown time  . FLUoxetine (PROZAC) 40 MG capsule Take 40 mg by mouth every morning.    Past Week at Unknown time  . lidocaine (LIDODERM) 5 % Place 1 patch onto the skin daily as needed for pain.  2   . LYSINE PO Take 500 mg by mouth daily.     .  meloxicam (MOBIC) 15 MG tablet Take 15 mg by mouth daily.     . Multiple Vitamins-Minerals (HAIR SKIN AND NAILS FORMULA) TABS Take 1 tablet by mouth daily.     Marland Kitchen oxyCODONE-acetaminophen (PERCOCET) 10-325 MG tablet Take 1 tablet by mouth every 6 (six) hours as needed for pain.     . promethazine (PHENERGAN) 25 MG tablet Take 25 mg by mouth every 6 (six) hours as needed for nausea or vomiting.    Taking  . triamterene-hydrochlorothiazide (MAXZIDE) 75-50 MG tablet Take 1 tablet by mouth every morning.    02/27/2018 at 0600  . acetaminophen (TYLENOL) 500 MG tablet Take 2 tablets (1,000 mg total) by mouth every 8 (eight) hours. (Patient not taking: Reported on 05/05/2018) 30 tablet 0 Not Taking at Unknown time  . docusate sodium (COLACE) 100 MG capsule Take 1 capsule (100 mg total) by mouth 2 (two) times daily. (Patient not taking: Reported on 05/05/2018) 10 capsule 0 Not Taking at Unknown time  . ferrous sulfate (FERROUSUL) 325 (65 FE) MG tablet Take 1 tablet (325 mg total) by mouth 3 (three) times daily with meals. (Patient not taking: Reported on 05/05/2018)  3 Not Taking at Unknown time  . polyethylene glycol (MIRALAX / GLYCOLAX) packet Take 17 g by mouth 2 (two) times daily. (Patient not taking: Reported on 05/05/2018) 14 each 0 Not Taking at Unknown time   Allergies  Allergen Reactions  . Elavil [Amitriptyline] Other (See Comments)    Urine retention  . Topamax [Topiramate] Other (See Comments)    Numbness in fingertips    Social History   Tobacco Use  . Smoking status: Never Smoker  . Smokeless tobacco: Never Used  Substance Use Topics  . Alcohol use: Yes    Comment: rare       Review of Systems  Constitutional: Negative.   HENT: Negative.   Eyes: Negative.   Respiratory: Negative.   Cardiovascular: Negative.   Gastrointestinal: Positive for heartburn.  Genitourinary: Negative.   Musculoskeletal: Positive for back pain and joint pain.  Skin: Negative.   Neurological: Negative.    Endo/Heme/Allergies: Negative.   Psychiatric/Behavioral: Negative.     Objective:  Physical Exam  Constitutional: She is oriented to person, place, and time. She appears well-developed.  HENT:  Head: Normocephalic.  Eyes: Pupils are equal, round, and reactive to light.  Neck: Neck supple. No JVD present. No tracheal deviation present. No thyromegaly present.  Cardiovascular: Normal rate, regular rhythm and intact distal pulses.  Respiratory: Effort normal and breath sounds normal. No respiratory distress. She has no wheezes.  GI: Soft. There is no tenderness. There is no guarding.  Musculoskeletal:  Left knee: She exhibits decreased range of motion, swelling and bony tenderness. She exhibits no ecchymosis, no deformity, no laceration and no erythema. Tenderness found.  Lymphadenopathy:    She has no cervical adenopathy.  Neurological: She is alert and oriented to person, place, and time.  Skin: Skin is warm and dry.  Psychiatric: She has a normal mood and affect.       Labs:  Estimated body mass index is 40.27 kg/m as calculated from the following:   Height as of 02/27/18: 5\' 5"  (1.651 m).   Weight as of 02/27/18: 109.8 kg (242 lb).   Imaging Review Plain radiographs demonstrate severe degenerative joint disease of the left knee(s).  The bone quality appears to be good for age and reported activity level.   Preoperative templating of the joint replacement has been completed, documented, and submitted to the Operating Room personnel in order to optimize intra-operative equipment management.   Anticipated LOS equal to or greater than 2 midnights due to  - Expected need for hospital services (PT, OT, Nursing) required for safe  discharge  - Active co-morbidities: Chronic pain requiring opiods      Assessment/Plan:  End stage arthritis, left knee   The patient history, physical examination, clinical judgment of the provider and imaging studies are consistent with  end stage degenerative joint disease of the left knee(s) and total knee arthroplasty is deemed medically necessary. The treatment options including medical management, injection therapy arthroscopy and arthroplasty were discussed at length. The risks and benefits of total knee arthroplasty were presented and reviewed. The risks due to aseptic loosening, infection, stiffness, patella tracking problems, thromboembolic complications and other imponderables were discussed. The patient acknowledged the explanation, agreed to proceed with the plan and consent was signed. Patient is being admitted for inpatient treatment for surgery, pain control, PT, OT, prophylactic antibiotics, VTE prophylaxis, progressive ambulation and ADL's and discharge planning. The patient is planning to be discharged home.     Anastasio AuerbachMatthew S. Ermal Brzozowski   PA-C  05/08/2018, 12:25 PM

## 2018-05-09 ENCOUNTER — Encounter (HOSPITAL_COMMUNITY)
Admission: RE | Admit: 2018-05-09 | Discharge: 2018-05-09 | Disposition: A | Discharge status: Home / Self Care | Payer: BLUE CROSS/BLUE SHIELD | Source: Ambulatory Visit | Attending: Orthopedic Surgery | Admitting: Orthopedic Surgery

## 2018-05-09 ENCOUNTER — Other Ambulatory Visit: Payer: Self-pay

## 2018-05-09 ENCOUNTER — Encounter (HOSPITAL_COMMUNITY): Payer: Self-pay

## 2018-05-09 DIAGNOSIS — M1712 Unilateral primary osteoarthritis, left knee: Secondary | ICD-10-CM | POA: Diagnosis not present

## 2018-05-09 DIAGNOSIS — Z01812 Encounter for preprocedural laboratory examination: Secondary | ICD-10-CM | POA: Diagnosis not present

## 2018-05-09 HISTORY — DX: Anxiety disorder, unspecified: F41.9

## 2018-05-09 HISTORY — DX: Depression, unspecified: F32.A

## 2018-05-09 HISTORY — DX: Major depressive disorder, single episode, unspecified: F32.9

## 2018-05-09 LAB — CBC
HEMATOCRIT: 30.4 % — AB (ref 36.0–46.0)
HEMOGLOBIN: 10.2 g/dL — AB (ref 12.0–15.0)
MCH: 24.7 pg — ABNORMAL LOW (ref 26.0–34.0)
MCHC: 33.6 g/dL (ref 30.0–36.0)
MCV: 73.6 fL — ABNORMAL LOW (ref 78.0–100.0)
Platelets: 353 10*3/uL (ref 150–400)
RBC: 4.13 MIL/uL (ref 3.87–5.11)
RDW: 15.7 % — ABNORMAL HIGH (ref 11.5–15.5)
WBC: 11.7 10*3/uL — ABNORMAL HIGH (ref 4.0–10.5)

## 2018-05-09 LAB — BASIC METABOLIC PANEL
ANION GAP: 5 (ref 5–15)
BUN: 15 mg/dL (ref 6–20)
CHLORIDE: 101 mmol/L (ref 101–111)
CO2: 31 mmol/L (ref 22–32)
Calcium: 8.7 mg/dL — ABNORMAL LOW (ref 8.9–10.3)
Creatinine, Ser: 1.24 mg/dL — ABNORMAL HIGH (ref 0.44–1.00)
GFR calc Af Amer: 57 mL/min — ABNORMAL LOW (ref >60)
GFR, EST NON AFRICAN AMERICAN: 49 mL/min — AB (ref >60)
Glucose, Bld: 80 mg/dL (ref 65–99)
POTASSIUM: 3.6 mmol/L (ref 3.5–5.1)
Sodium: 137 mmol/L (ref 135–145)

## 2018-05-09 LAB — SURGICAL PCR SCREEN
MRSA, PCR: NEGATIVE
STAPHYLOCOCCUS AUREUS: POSITIVE — AB

## 2018-05-09 NOTE — Patient Instructions (Signed)
Etta Grandchildvelyn M Vanover  05/09/2018   Your procedure is scheduled on: 05-11-18  [facility]   Report to admitting at      0730 AM    Call this number if you have problems the morning of surgery [phone]    Remember: Do not eat food or drink liquids :After Midnight.     Take these medicines the morning of surgery with A SIP OF WATER: xanax if needed, nexium, prozac                                You may not have any metal on your body including hair pins and              piercings  Do not wear jewelry, make-up, lotions, powders or perfumes, deodorant             Do not wear nail polish.  Do not shave  48 hours prior to surgery.       Do not bring valuables to the hospital. [facility] IS NOT             RESPONSIBLE   FOR VALUABLES.  Contacts, dentures or bridgework may not be worn into surgery.  Leave suitcase in the car. After surgery it may be brought to your room.               Please read over the following fact sheets you were given: _____________________________________________________________________          [facility] Pre[facility] Before surgery, you can play an important role.  Because skin is not sterile, your skin needs to be as free of germs as possible.  You can reduce the number of germs on your skin by washing with CHG (chlorahexidine gluconate) soap before surgery.  CHG is an antiseptic cleaner which kills germs and bonds with the skin to continue killing germs even after washing. Please DO NOT use if you have an allergy to CHG or antibacterial soaps.  If your skin becomes reddened/irritated stop using the CHG and inform your nurse when you arrive at Short Stay. Do not shave (including legs and underarms) for at least 48 hours prior to the first CHG shower.  You may shave your face/neck. Please follow these instructions carefully:  1.  Shower with CHG Soap the night before surgery and the  morning of  Surgery.  2.  If you choose to wash your hair, wash your hair first as usual with your  normal  shampoo.  3.  After you shampoo, rinse your hair and body thoroughly to remove the  shampoo.                           4.  Use CHG as you would any other liquid soap.  You can apply chg directly  to the skin and wash                       Gently with a scrungie or clean washcloth.  5.  Apply the CHG Soap to your body ONLY FROM THE NECK DOWN.   Do not use on face/ open  Wound or open sores. Avoid contact with eyes, ears mouth and genitals (private parts).                       Wash face,  Genitals (private parts) with your normal soap.             6.  Wash thoroughly, paying special attention to the area where your surgery  will be performed.  7.  Thoroughly rinse your body with warm water from the neck down.  8.  DO NOT shower/wash with your normal soap after using and rinsing off  the CHG Soap.                9.  Pat yourself dry with a clean towel.            10.  Wear clean pajamas.            11.  Place clean sheets on your bed the night of your first shower and do not  sleep with pets. Day of Surgery : Do not apply any lotions/deodorants the morning of surgery.  Please wear clean clothes to the hospital/surgery center.  FAILURE TO FOLLOW THESE INSTRUCTIONS MAY RESULT IN THE CANCELLATION OF YOUR SURGERY PATIENT SIGNATURE_________________________________  NURSE SIGNATURE__________________________________  ________________________________________________________________________  WHAT IS A BLOOD TRANSFUSION? Blood Transfusion Information  A transfusion is the replacement of blood or some of its parts. Blood is made up of multiple cells which provide different functions.  Red blood cells carry oxygen and are used for blood loss replacement.  White blood cells fight against infection.  Platelets control bleeding.  Plasma helps clot blood.  Other blood products are  available for specialized needs, such as hemophilia or other clotting disorders. BEFORE THE TRANSFUSION  Who gives blood for transfusions?   Healthy volunteers who are fully evaluated to make sure their blood is safe. This is blood bank blood. Transfusion therapy is the safest it has ever been in the practice of medicine. Before blood is taken from a donor, a complete history is taken to make sure that person has no history of diseases nor engages in risky social behavior (examples are intravenous drug use or sexual activity with multiple partners). The donor's travel history is screened to minimize risk of transmitting infections, such as malaria. The donated blood is tested for signs of infectious diseases, such as HIV and hepatitis. The blood is then tested to be sure it is compatible with you in order to minimize the chance of a transfusion reaction. If you or a relative donates blood, this is often done in anticipation of surgery and is not appropriate for emergency situations. It takes many days to process the donated blood. RISKS AND COMPLICATIONS Although transfusion therapy is very safe and saves many lives, the main dangers of transfusion include:   Getting an infectious disease.  Developing a transfusion reaction. This is an allergic reaction to something in the blood you were given. Every precaution is taken to prevent this. The decision to have a blood transfusion has been considered carefully by your caregiver before blood is given. Blood is not given unless the benefits outweigh the risks. AFTER THE TRANSFUSION  Right after receiving a blood transfusion, you will usually feel much better and more energetic. This is especially true if your red blood cells have gotten low (anemic). The transfusion raises the level of the red blood cells which carry oxygen, and this usually causes an energy increase.  The  nurse administering the transfusion will monitor you carefully for  complications. HOME CARE INSTRUCTIONS  No special instructions are needed after a transfusion. You may find your energy is better. Speak with your caregiver about any limitations on activity for underlying diseases you may have. SEEK MEDICAL CARE IF:   Your condition is not improving after your transfusion.  You develop redness or irritation at the intravenous (IV) site. SEEK IMMEDIATE MEDICAL CARE IF:  Any of the following symptoms occur over the next 12 hours:  Shaking chills.  You have a temperature by mouth above 102 F (38.9 C), not controlled by medicine.  Chest, back, or muscle pain.  People around you feel you are not acting correctly or are confused.  Shortness of breath or difficulty breathing.  Dizziness and fainting.  You get a rash or develop hives.  You have a decrease in urine output.  Your urine turns a dark color or changes to pink, red, or brown. Any of the following symptoms occur over the next 10 days:  You have a temperature by mouth above 102 F (38.9 C), not controlled by medicine.  Shortness of breath.  Weakness after normal activity.  The white part of the eye turns yellow (jaundice).  You have a decrease in the amount of urine or are urinating less often.  Your urine turns a dark color or changes to pink, red, or brown. Document Released: 11/12/2000 Document Revised: 02/07/2012 Document Reviewed: 07/01/2008 ExitCare Patient Information 2014 [place].  _______________________________________________________________________  Incentive Spirometer  An incentive spirometer is a tool that can help keep your lungs clear and active. This tool measures how well you are filling your lungs with each breath. Taking long deep breaths may help reverse or decrease the chance of developing breathing (pulmonary) problems (especially infection) following:  A long period of time when you are unable to move or be active. BEFORE THE PROCEDURE   If  the spirometer includes an indicator to show your best effort, your nurse or respiratory therapist will set it to a desired goal.  If possible, sit up straight or lean slightly forward. Try not to slouch.  Hold the incentive spirometer in an upright position. INSTRUCTIONS FOR USE  1. Sit on the edge of your bed if possible, or sit up as far as you can in bed or on a chair. 2. Hold the incentive spirometer in an upright position. 3. Breathe out normally. 4. Place the mouthpiece in your mouth and seal your lips tightly around it. 5. Breathe in slowly and as deeply as possible, raising the piston or the ball toward the top of the column. 6. Hold your breath for 3-5 seconds or for as long as possible. Allow the piston or ball to fall to the bottom of the column. 7. Remove the mouthpiece from your mouth and breathe out normally. 8. Rest for a few seconds and repeat Steps 1 through 7 at least 10 times every 1-2 hours when you are awake. Take your time and take a few normal breaths between deep breaths. 9. The spirometer may include an indicator to show your best effort. Use the indicator as a goal to work toward during each repetition. 10. After each set of 10 deep breaths, practice coughing to be sure your lungs are clear. If you have an incision (the cut made at the time of surgery), support your incision when coughing by placing a pillow or rolled up towels firmly against it. Once you are able to get  out of bed, walk around indoors and cough well. You may stop using the incentive spirometer when instructed by your caregiver.  RISKS AND COMPLICATIONS  Take your time so you do not get dizzy or light-headed.  If you are in pain, you may need to take or ask for pain medication before doing incentive spirometry. It is harder to take a deep breath if you are having pain. AFTER USE  Rest and breathe slowly and easily.  It can be helpful to keep track of a log of your progress. Your caregiver can  provide you with a simple table to help with this. If you are using the spirometer at home, follow these instructions: [place] IF:   You are having difficultly using the spirometer.  You have trouble using the spirometer as often as instructed.  Your pain medication is not giving enough relief while using the spirometer.  You develop fever of 100.5 F (38.1 C) or higher. SEEK IMMEDIATE MEDICAL CARE IF:   You cough up bloody sputum that had not been present before.  You develop fever of 102 F (38.9 C) or greater.  You develop worsening pain at or near the incision site. MAKE SURE YOU:   Understand these instructions.  Will watch your condition.  Will get help right away if you are not doing well or get worse. Document Released: 03/28/2007 Document Revised: 02/07/2012 Document Reviewed: 05/29/2007 [facility] Patient Information 2014 [place], [place].   ________________________________________________________________________

## 2018-05-10 MED ORDER — TRANEXAMIC ACID 1000 MG/10ML IV SOLN
1000.0000 mg | INTRAVENOUS | Status: AC
  Administered 2018-05-11: 1000 mg via INTRAVENOUS
  Filled 2018-05-10: qty 1100

## 2018-05-11 ENCOUNTER — Observation Stay (HOSPITAL_COMMUNITY)
Admission: RE | Admit: 2018-05-11 | Discharge: 2018-05-12 | Disposition: A | Discharge status: Home / Self Care | Payer: BLUE CROSS/BLUE SHIELD | Source: Ambulatory Visit | Attending: Orthopedic Surgery | Admitting: Orthopedic Surgery

## 2018-05-11 ENCOUNTER — Encounter (HOSPITAL_COMMUNITY): Payer: Self-pay | Admitting: Emergency Medicine

## 2018-05-11 ENCOUNTER — Other Ambulatory Visit: Payer: Self-pay

## 2018-05-11 ENCOUNTER — Inpatient Hospital Stay (HOSPITAL_COMMUNITY): Payer: BLUE CROSS/BLUE SHIELD | Admitting: Anesthesiology

## 2018-05-11 ENCOUNTER — Encounter (HOSPITAL_COMMUNITY)
Admission: RE | Disposition: A | Discharge status: Home / Self Care | Payer: Self-pay | Source: Ambulatory Visit | Attending: Orthopedic Surgery

## 2018-05-11 DIAGNOSIS — Z79899 Other long term (current) drug therapy: Secondary | ICD-10-CM | POA: Insufficient documentation

## 2018-05-11 DIAGNOSIS — F419 Anxiety disorder, unspecified: Secondary | ICD-10-CM | POA: Insufficient documentation

## 2018-05-11 DIAGNOSIS — Z7982 Long term (current) use of aspirin: Secondary | ICD-10-CM | POA: Insufficient documentation

## 2018-05-11 DIAGNOSIS — K5909 Other constipation: Secondary | ICD-10-CM | POA: Insufficient documentation

## 2018-05-11 DIAGNOSIS — F329 Major depressive disorder, single episode, unspecified: Secondary | ICD-10-CM | POA: Insufficient documentation

## 2018-05-11 DIAGNOSIS — Z96659 Presence of unspecified artificial knee joint: Secondary | ICD-10-CM

## 2018-05-11 DIAGNOSIS — Z6837 Body mass index (BMI) 37.0-37.9, adult: Secondary | ICD-10-CM | POA: Insufficient documentation

## 2018-05-11 DIAGNOSIS — G8918 Other acute postprocedural pain: Secondary | ICD-10-CM | POA: Diagnosis not present

## 2018-05-11 DIAGNOSIS — I10 Essential (primary) hypertension: Secondary | ICD-10-CM | POA: Diagnosis not present

## 2018-05-11 DIAGNOSIS — Z96652 Presence of left artificial knee joint: Secondary | ICD-10-CM

## 2018-05-11 DIAGNOSIS — M1712 Unilateral primary osteoarthritis, left knee: Principal | ICD-10-CM | POA: Insufficient documentation

## 2018-05-11 DIAGNOSIS — Z6839 Body mass index (BMI) 39.0-39.9, adult: Secondary | ICD-10-CM

## 2018-05-11 DIAGNOSIS — E66812 Obesity, class 2: Secondary | ICD-10-CM

## 2018-05-11 DIAGNOSIS — K219 Gastro-esophageal reflux disease without esophagitis: Secondary | ICD-10-CM | POA: Insufficient documentation

## 2018-05-11 DIAGNOSIS — E669 Obesity, unspecified: Secondary | ICD-10-CM

## 2018-05-11 HISTORY — PX: TOTAL KNEE ARTHROPLASTY: SHX125

## 2018-05-11 LAB — TYPE AND SCREEN
ABO/RH(D): O POS
Antibody Screen: NEGATIVE

## 2018-05-11 SURGERY — ARTHROPLASTY, KNEE, TOTAL
Anesthesia: Spinal | Site: Knee | Laterality: Left

## 2018-05-11 MED ORDER — DEXAMETHASONE SODIUM PHOSPHATE 10 MG/ML IJ SOLN
10.0000 mg | Freq: Once | INTRAMUSCULAR | Status: AC
  Administered 2018-05-11: 10 mg via INTRAVENOUS

## 2018-05-11 MED ORDER — PROPOFOL 500 MG/50ML IV EMUL
INTRAVENOUS | Status: DC | PRN
  Administered 2018-05-11: 50 ug/kg/min via INTRAVENOUS

## 2018-05-11 MED ORDER — ONDANSETRON HCL 4 MG/2ML IJ SOLN
INTRAMUSCULAR | Status: DC | PRN
  Administered 2018-05-11: 4 mg via INTRAVENOUS

## 2018-05-11 MED ORDER — ASPIRIN 81 MG PO CHEW
81.0000 mg | CHEWABLE_TABLET | Freq: Two times a day (BID) | ORAL | Status: DC
  Administered 2018-05-11 – 2018-05-12 (×2): 81 mg via ORAL
  Filled 2018-05-11 (×2): qty 1

## 2018-05-11 MED ORDER — PHENYLEPHRINE HCL 10 MG/ML IJ SOLN
INTRAVENOUS | Status: DC | PRN
  Administered 2018-05-11: 40 ug/min via INTRAVENOUS

## 2018-05-11 MED ORDER — KETOROLAC TROMETHAMINE 30 MG/ML IJ SOLN
INTRAMUSCULAR | Status: DC | PRN
  Administered 2018-05-11: 30 mg

## 2018-05-11 MED ORDER — PROMETHAZINE HCL 25 MG/ML IJ SOLN: 6.2500 mg | INTRAMUSCULAR | Status: DC | PRN

## 2018-05-11 MED ORDER — DEXAMETHASONE SODIUM PHOSPHATE 10 MG/ML IJ SOLN
10.0000 mg | Freq: Once | INTRAMUSCULAR | Status: AC
  Administered 2018-05-12: 10 mg via INTRAVENOUS
  Filled 2018-05-11: qty 1

## 2018-05-11 MED ORDER — LACTATED RINGERS IV SOLN
INTRAVENOUS | Status: DC
  Administered 2018-05-11 (×2): via INTRAVENOUS

## 2018-05-11 MED ORDER — PHENYLEPHRINE 40 MCG/ML (10ML) SYRINGE FOR IV PUSH (FOR BLOOD PRESSURE SUPPORT)
PREFILLED_SYRINGE | INTRAVENOUS | Status: DC | PRN
  Administered 2018-05-11 (×2): 80 ug via INTRAVENOUS

## 2018-05-11 MED ORDER — ALPRAZOLAM 1 MG PO TABS: 1.0000 mg | ORAL_TABLET | Freq: Two times a day (BID) | ORAL | Status: DC | PRN

## 2018-05-11 MED ORDER — ONDANSETRON HCL 4 MG/2ML IJ SOLN
INTRAMUSCULAR | Status: AC
  Filled 2018-05-11: qty 2

## 2018-05-11 MED ORDER — ACETAMINOPHEN 500 MG PO TABS
1000.0000 mg | ORAL_TABLET | Freq: Four times a day (QID) | ORAL | Status: AC
  Administered 2018-05-11 – 2018-05-12 (×4): 1000 mg via ORAL
  Filled 2018-05-11 (×4): qty 2

## 2018-05-11 MED ORDER — CEFAZOLIN SODIUM-DEXTROSE 2-4 GM/100ML-% IV SOLN
2.0000 g | Freq: Four times a day (QID) | INTRAVENOUS | Status: AC
  Administered 2018-05-11 (×2): 2 g via INTRAVENOUS
  Filled 2018-05-11 (×2): qty 100

## 2018-05-11 MED ORDER — MIDAZOLAM HCL 2 MG/2ML IJ SOLN
INTRAMUSCULAR | Status: AC
  Filled 2018-05-11: qty 2

## 2018-05-11 MED ORDER — STERILE WATER FOR IRRIGATION IR SOLN
Status: DC | PRN
  Administered 2018-05-11: 2000 mL

## 2018-05-11 MED ORDER — ONDANSETRON HCL 4 MG PO TABS: 4.0000 mg | ORAL_TABLET | Freq: Four times a day (QID) | ORAL | Status: DC | PRN

## 2018-05-11 MED ORDER — TRANEXAMIC ACID 1000 MG/10ML IV SOLN
1000.0000 mg | Freq: Once | INTRAVENOUS | Status: AC
  Administered 2018-05-11: 1000 mg via INTRAVENOUS
  Filled 2018-05-11: qty 10

## 2018-05-11 MED ORDER — HYDROMORPHONE HCL 1 MG/ML IJ SOLN
0.5000 mg | INTRAMUSCULAR | Status: DC | PRN
  Administered 2018-05-11: 1 mg via INTRAVENOUS
  Filled 2018-05-11: qty 1

## 2018-05-11 MED ORDER — BUPIVACAINE IN DEXTROSE 0.75-8.25 % IT SOLN
INTRATHECAL | Status: DC | PRN
  Administered 2018-05-11: 2 mL via INTRATHECAL

## 2018-05-11 MED ORDER — PROPOFOL 10 MG/ML IV BOLUS
INTRAVENOUS | Status: AC
  Filled 2018-05-11: qty 60

## 2018-05-11 MED ORDER — TRIAMTERENE-HCTZ 75-50 MG PO TABS
1.0000 | ORAL_TABLET | Freq: Every morning | ORAL | Status: DC
  Administered 2018-05-11 – 2018-05-12 (×2): 1 via ORAL
  Filled 2018-05-11 (×2): qty 1

## 2018-05-11 MED ORDER — ONDANSETRON HCL 4 MG/2ML IJ SOLN: 4.0000 mg | Freq: Four times a day (QID) | INTRAMUSCULAR | Status: DC | PRN

## 2018-05-11 MED ORDER — PHENYLEPHRINE 40 MCG/ML (10ML) SYRINGE FOR IV PUSH (FOR BLOOD PRESSURE SUPPORT)
PREFILLED_SYRINGE | INTRAVENOUS | Status: AC
  Filled 2018-05-11: qty 10

## 2018-05-11 MED ORDER — SODIUM CHLORIDE 0.9 % IR SOLN
Status: DC | PRN
  Administered 2018-05-11: 1000 mL

## 2018-05-11 MED ORDER — DIPHENHYDRAMINE HCL 12.5 MG/5ML PO ELIX: 12.5000 mg | ORAL_SOLUTION | ORAL | Status: DC | PRN

## 2018-05-11 MED ORDER — CHLORHEXIDINE GLUCONATE 4 % EX LIQD: 60.0000 mL | Freq: Once | CUTANEOUS | Status: DC

## 2018-05-11 MED ORDER — FENTANYL CITRATE (PF) 100 MCG/2ML IJ SOLN: 25.0000 ug | INTRAMUSCULAR | Status: DC | PRN

## 2018-05-11 MED ORDER — BISACODYL 10 MG RE SUPP: 10.0000 mg | Freq: Every day | RECTAL | Status: DC | PRN

## 2018-05-11 MED ORDER — OXYCODONE HCL 5 MG PO TABS
20.0000 mg | ORAL_TABLET | ORAL | Status: DC | PRN
  Administered 2018-05-11 – 2018-05-12 (×4): 20 mg via ORAL
  Filled 2018-05-11 (×3): qty 4

## 2018-05-11 MED ORDER — FERROUS SULFATE 325 (65 FE) MG PO TABS
325.0000 mg | ORAL_TABLET | Freq: Three times a day (TID) | ORAL | Status: DC
  Administered 2018-05-12: 325 mg via ORAL
  Filled 2018-05-11: qty 1

## 2018-05-11 MED ORDER — BUPIVACAINE-EPINEPHRINE (PF) 0.25% -1:200000 IJ SOLN
INTRAMUSCULAR | Status: AC
  Filled 2018-05-11: qty 30

## 2018-05-11 MED ORDER — PHENOL 1.4 % MT LIQD: 1.0000 | OROMUCOSAL | Status: DC | PRN

## 2018-05-11 MED ORDER — METOCLOPRAMIDE HCL 5 MG PO TABS: 5.0000 mg | ORAL_TABLET | Freq: Three times a day (TID) | ORAL | Status: DC | PRN

## 2018-05-11 MED ORDER — CELECOXIB 200 MG PO CAPS
200.0000 mg | ORAL_CAPSULE | Freq: Two times a day (BID) | ORAL | Status: DC
  Administered 2018-05-11 – 2018-05-12 (×2): 200 mg via ORAL
  Filled 2018-05-11 (×2): qty 1

## 2018-05-11 MED ORDER — KETOROLAC TROMETHAMINE 30 MG/ML IJ SOLN
INTRAMUSCULAR | Status: AC
  Filled 2018-05-11: qty 1

## 2018-05-11 MED ORDER — MAGNESIUM CITRATE PO SOLN: 1.0000 | Freq: Once | ORAL | Status: DC | PRN

## 2018-05-11 MED ORDER — DEXAMETHASONE SODIUM PHOSPHATE 10 MG/ML IJ SOLN
INTRAMUSCULAR | Status: AC
  Filled 2018-05-11: qty 1

## 2018-05-11 MED ORDER — DOCUSATE SODIUM 100 MG PO CAPS
100.0000 mg | ORAL_CAPSULE | Freq: Two times a day (BID) | ORAL | Status: DC
  Administered 2018-05-11 – 2018-05-12 (×2): 100 mg via ORAL
  Filled 2018-05-11 (×2): qty 1

## 2018-05-11 MED ORDER — METHOCARBAMOL 1000 MG/10ML IJ SOLN
500.0000 mg | Freq: Four times a day (QID) | INTRAMUSCULAR | Status: DC | PRN
  Administered 2018-05-11: 500 mg via INTRAVENOUS
  Filled 2018-05-11: qty 550

## 2018-05-11 MED ORDER — METHOCARBAMOL 500 MG PO TABS
500.0000 mg | ORAL_TABLET | Freq: Four times a day (QID) | ORAL | Status: DC | PRN
  Administered 2018-05-12: 500 mg via ORAL
  Filled 2018-05-11 (×2): qty 1

## 2018-05-11 MED ORDER — SODIUM CHLORIDE 0.9 % IJ SOLN
INTRAMUSCULAR | Status: AC
  Filled 2018-05-11: qty 50

## 2018-05-11 MED ORDER — SODIUM CHLORIDE 0.9 % IV SOLN
INTRAVENOUS | Status: DC
  Administered 2018-05-11: 15:00:00 via INTRAVENOUS

## 2018-05-11 MED ORDER — FENTANYL CITRATE (PF) 100 MCG/2ML IJ SOLN
INTRAMUSCULAR | Status: AC
  Filled 2018-05-11: qty 2

## 2018-05-11 MED ORDER — MIDAZOLAM HCL 5 MG/5ML IJ SOLN
INTRAMUSCULAR | Status: DC | PRN
  Administered 2018-05-11: 1 mg via INTRAVENOUS

## 2018-05-11 MED ORDER — PANTOPRAZOLE SODIUM 40 MG PO TBEC
40.0000 mg | DELAYED_RELEASE_TABLET | Freq: Every morning | ORAL | Status: DC
  Administered 2018-05-12: 40 mg via ORAL
  Filled 2018-05-11: qty 1

## 2018-05-11 MED ORDER — FENTANYL CITRATE (PF) 100 MCG/2ML IJ SOLN
INTRAMUSCULAR | Status: DC | PRN
  Administered 2018-05-11: 50 ug via INTRAVENOUS

## 2018-05-11 MED ORDER — CEFAZOLIN SODIUM-DEXTROSE 2-4 GM/100ML-% IV SOLN
2.0000 g | INTRAVENOUS | Status: AC
  Administered 2018-05-11: 2 g via INTRAVENOUS
  Filled 2018-05-11: qty 100

## 2018-05-11 MED ORDER — ROPIVACAINE HCL 7.5 MG/ML IJ SOLN
INTRAMUSCULAR | Status: DC | PRN
  Administered 2018-05-11: 20 mL via PERINEURAL

## 2018-05-11 MED ORDER — SODIUM CHLORIDE 0.9 % IJ SOLN
INTRAMUSCULAR | Status: DC | PRN
  Administered 2018-05-11: 30 mL

## 2018-05-11 MED ORDER — METOCLOPRAMIDE HCL 5 MG/ML IJ SOLN: 5.0000 mg | Freq: Three times a day (TID) | INTRAMUSCULAR | Status: DC | PRN

## 2018-05-11 MED ORDER — PROPOFOL 10 MG/ML IV BOLUS
INTRAVENOUS | Status: DC | PRN
  Administered 2018-05-11: 30 mg via INTRAVENOUS

## 2018-05-11 MED ORDER — MENTHOL 3 MG MT LOZG: 1.0000 | LOZENGE | OROMUCOSAL | Status: DC | PRN

## 2018-05-11 MED ORDER — POLYETHYLENE GLYCOL 3350 17 G PO PACK
17.0000 g | PACK | Freq: Two times a day (BID) | ORAL | Status: DC
  Administered 2018-05-11 – 2018-05-12 (×2): 17 g via ORAL
  Filled 2018-05-11 (×2): qty 1

## 2018-05-11 MED ORDER — FENTANYL CITRATE (PF) 100 MCG/2ML IJ SOLN
100.0000 ug | Freq: Once | INTRAMUSCULAR | Status: AC
  Administered 2018-05-11: 50 ug via INTRAVENOUS
  Filled 2018-05-11: qty 2

## 2018-05-11 MED ORDER — BUPIVACAINE-EPINEPHRINE (PF) 0.25% -1:200000 IJ SOLN
INTRAMUSCULAR | Status: DC | PRN
  Administered 2018-05-11: 30 mL

## 2018-05-11 MED ORDER — FLUOXETINE HCL 20 MG PO CAPS
40.0000 mg | ORAL_CAPSULE | Freq: Every morning | ORAL | Status: DC
  Administered 2018-05-11 – 2018-05-12 (×2): 40 mg via ORAL
  Filled 2018-05-11 (×2): qty 2

## 2018-05-11 MED ORDER — PHENYLEPHRINE HCL 10 MG/ML IJ SOLN
INTRAMUSCULAR | Status: AC
  Filled 2018-05-11: qty 1

## 2018-05-11 MED ORDER — ALUM & MAG HYDROXIDE-SIMETH 200-200-20 MG/5ML PO SUSP: 15.0000 mL | ORAL | Status: DC | PRN

## 2018-05-11 MED ORDER — OXYCODONE HCL 5 MG PO TABS
10.0000 mg | ORAL_TABLET | ORAL | Status: DC | PRN
  Filled 2018-05-11 (×2): qty 2

## 2018-05-11 MED ORDER — MIDAZOLAM HCL 2 MG/2ML IJ SOLN
2.0000 mg | Freq: Once | INTRAMUSCULAR | Status: AC
  Administered 2018-05-11: 1 mg via INTRAVENOUS
  Filled 2018-05-11: qty 2

## 2018-05-11 SURGICAL SUPPLY — 48 items
BAG DECANTER FOR FLEXI CONT (MISCELLANEOUS) IMPLANT
BAG ZIPLOCK 12X15 (MISCELLANEOUS) IMPLANT
BANDAGE ACE 6X5 VEL STRL LF (GAUZE/BANDAGES/DRESSINGS) ×3 IMPLANT
BLADE SAW SGTL 11.0X1.19X90.0M (BLADE) IMPLANT
BLADE SAW SGTL 13.0X1.19X90.0M (BLADE) ×3 IMPLANT
BOWL SMART MIX CTS (DISPOSABLE) ×3 IMPLANT
CAPT KNEE TOTAL 3 ATTUNE ×3 IMPLANT
CEMENT HV SMART SET (Cement) ×6 IMPLANT
COVER SURGICAL LIGHT HANDLE (MISCELLANEOUS) ×3 IMPLANT
CUFF TOURN SGL QUICK 34 (TOURNIQUET CUFF) ×2
CUFF TRNQT CYL 34X4X40X1 (TOURNIQUET CUFF) ×1 IMPLANT
DECANTER SPIKE VIAL GLASS SM (MISCELLANEOUS) ×3 IMPLANT
DERMABOND ADVANCED (GAUZE/BANDAGES/DRESSINGS) ×2
DERMABOND ADVANCED .7 DNX12 (GAUZE/BANDAGES/DRESSINGS) ×1 IMPLANT
DRAPE U-SHAPE 47X51 STRL (DRAPES) ×3 IMPLANT
DRESSING AQUACEL AG SP 3.5X10 (GAUZE/BANDAGES/DRESSINGS) ×1 IMPLANT
DRSG AQUACEL AG SP 3.5X10 (GAUZE/BANDAGES/DRESSINGS) ×3
DURAPREP 26ML APPLICATOR (WOUND CARE) ×6 IMPLANT
ELECT REM PT RETURN 15FT ADLT (MISCELLANEOUS) ×3 IMPLANT
GLOVE BIOGEL M 7.0 STRL (GLOVE) IMPLANT
GLOVE BIOGEL PI IND STRL 7.5 (GLOVE) ×1 IMPLANT
GLOVE BIOGEL PI IND STRL 8.5 (GLOVE) ×1 IMPLANT
GLOVE BIOGEL PI INDICATOR 7.5 (GLOVE) ×2
GLOVE BIOGEL PI INDICATOR 8.5 (GLOVE) ×2
GLOVE ECLIPSE 8.0 STRL XLNG CF (GLOVE) ×3 IMPLANT
GLOVE ORTHO TXT STRL SZ7.5 (GLOVE) ×6 IMPLANT
GOWN STRL REUS W/TWL 2XL LVL3 (GOWN DISPOSABLE) ×3 IMPLANT
GOWN STRL REUS W/TWL LRG LVL3 (GOWN DISPOSABLE) ×3 IMPLANT
HANDPIECE INTERPULSE COAX TIP (DISPOSABLE) ×2
HOLDER FOLEY CATH W/STRAP (MISCELLANEOUS) IMPLANT
MANIFOLD NEPTUNE II (INSTRUMENTS) ×3 IMPLANT
NDL SAFETY ECLIPSE 18X1.5 (NEEDLE) IMPLANT
NEEDLE HYPO 18GX1.5 SHARP (NEEDLE)
PACK TOTAL KNEE CUSTOM (KITS) ×3 IMPLANT
POSITIONER SURGICAL ARM (MISCELLANEOUS) ×3 IMPLANT
SET HNDPC FAN SPRY TIP SCT (DISPOSABLE) ×1 IMPLANT
SET PAD KNEE POSITIONER (MISCELLANEOUS) ×3 IMPLANT
SUT MNCRL AB 4-0 PS2 18 (SUTURE) ×3 IMPLANT
SUT STRATAFIX PDS+ 0 24IN (SUTURE) ×3 IMPLANT
SUT VIC AB 1 CT1 36 (SUTURE) ×3 IMPLANT
SUT VIC AB 2-0 CT1 27 (SUTURE) ×6
SUT VIC AB 2-0 CT1 TAPERPNT 27 (SUTURE) ×3 IMPLANT
SYR 3ML LL SCALE MARK (SYRINGE) IMPLANT
SYR 50ML LL SCALE MARK (SYRINGE) ×3 IMPLANT
TRAY FOLEY MTR SLVR 16FR STAT (SET/KITS/TRAYS/PACK) ×3 IMPLANT
WATER STERILE IRR 1000ML POUR (IV SOLUTION) ×3 IMPLANT
WRAP KNEE MAXI GEL POST OP (GAUZE/BANDAGES/DRESSINGS) ×3 IMPLANT
YANKAUER SUCT BULB TIP 10FT TU (MISCELLANEOUS) ×3 IMPLANT

## 2018-05-11 NOTE — Anesthesia Procedure Notes (Signed)
Anesthesia Regional Block: Adductor canal block   Pre-Anesthetic Checklist: ,, timeout performed, Correct Patient, Correct Site, Correct Laterality, Correct Procedure, Correct Position, site marked, Risks and benefits discussed,  Surgical consent,  Pre-op evaluation,  At surgeon's request and post-op pain management  Laterality: Left  Prep: chloraprep       Needles:  Injection technique: Single-shot  Needle Type: Echogenic Needle     Needle Length: 9cm  Needle Gauge: 21     Additional Needles:   Procedures:,,,, ultrasound used (permanent image in chart),,,,  Narrative:  Start time: 05/11/2018 9:13 AM End time: 05/11/2018 9:18 AM Injection made incrementally with aspirations every 5 mL.  Performed by: Personally  Anesthesiologist: Cecile Hearingurk, Stephen Edward, MD  Additional Notes: No pain on injection. No increased resistance to injection. Injection made in 5cc increments.  Good needle visualization.  Patient tolerated procedure well.

## 2018-05-11 NOTE — Evaluation (Signed)
Physical Therapy Evaluation Patient Details Name: Amanda Taylor MRN: [id number] DOB: November 29, 1966 Today's Date: 05/11/2018   History of Present Illness  s/P LTKA, R TKA 4/19  Clinical Impression  The patient is a bit impulsive. The patient is ambulating well. Plans DC tomorrow. Pt admitted with above diagnosis. Pt currently with functional limitations due to the deficits listed below (see PT Problem List). Pt will benefit from skilled PT to increase their independence and safety with mobility to allow discharge to the venue listed below.       Follow Up Recommendations Follow surgeon's recommendation for DC plan and follow-up therapies;Home health PT    Equipment Recommendations  None recommended by PT    Recommendations for Other Services       Precautions / Restrictions Precautions Precautions: Fall;Knee Restrictions Weight Bearing Restrictions: No      Mobility  Bed Mobility Overal bed mobility: Independent                Transfers Overall transfer level: Needs assistance Equipment used: Rolling walker (2 wheeled) Transfers: Sit to/from Stand Sit to Stand: Supervision         General transfer comment: cues for safety due to impulsivity  Ambulation/Gait Ambulation/Gait assistance: Min assist Gait Distance (Feet): 400 Feet Assistive device: Rolling walker (2 wheeled) Gait Pattern/deviations: Step-through pattern     General Gait Details: cues for safety  Stairs            Wheelchair Mobility    Modified Rankin (Stroke Patients Only)       Balance                                             Pertinent Vitals/Pain Pain Assessment: 0-10 Pain Score: 7  Pain Location: left knee Pain Descriptors / Indicators: Discomfort Pain Intervention(s): Premedicated before session;Monitored during session    Home Living Family/patient expects to be discharged to:: Private residence Living Arrangements: Spouse/significant  other Available Help at Discharge: Family;Available PRN/intermittently Type of Home: House Home Access: Stairs to enter   Secretary/administratorntrance Stairs-Number of Steps: 5+5 Home Layout: One level Home Equipment: None      Prior Function                 Hand Dominance        Extremity/Trunk Assessment   Upper Extremity Assessment Upper Extremity Assessment: Overall WFL for tasks assessed    Lower Extremity Assessment Lower Extremity Assessment: LLE deficits/detail LLE Deficits / Details: + SLR, knee flexion to 70    Cervical / Trunk Assessment Cervical / Trunk Assessment: Normal  Communication   Communication: No difficulties  Cognition Arousal/Alertness: Awake/alert Behavior During Therapy: Impulsive;WFL for tasks assessed/performed Overall Cognitive Status: Within Functional Limits for tasks assessed                                        General Comments      Exercises     Assessment/Plan    PT Assessment Patient needs continued PT services  PT Problem List Decreased strength;Decreased range of motion;Decreased activity tolerance;Decreased mobility;Decreased knowledge of precautions;Decreased safety awareness;Decreased knowledge of use of DME;Pain       PT Treatment Interventions DME instruction;Gait training;Stair training;Functional mobility training;Therapeutic activities;Patient/family education    PT Goals (Current goals can  be found in the Care Plan section)  Acute Rehab PT Goals Patient Stated Goal: to go home tonight. PT Goal Formulation: With patient Time For Goal Achievement: 05/13/18 Potential to Achieve Goals: Good    Frequency 7X/week   Barriers to discharge        Co-evaluation               AM-PAC PT "6 Clicks" Daily Activity  Outcome Measure Difficulty turning over in bed (including adjusting bedclothes, sheets and blankets)?: None Difficulty moving from lying on back to sitting on the side of the bed? :  None Difficulty sitting down on and standing up from a chair with arms (e.g., wheelchair, bedside commode, etc,.)?: A Little Help needed moving to and from a bed to chair (including a wheelchair)?: A Little Help needed walking in hospital room?: A Little Help needed climbing 3-5 steps with a railing? : A Little 6 Click Score: 20    End of Session   Activity Tolerance: Patient tolerated treatment well Patient left: in chair;with call bell/phone within reach;with chair alarm set;with nursing/sitter in room;with family/visitor present Nurse Communication: Mobility status PT Visit Diagnosis: Unsteadiness on feet (R26.81)    Time: 1610-9604 PT Time Calculation (min) (ACUTE ONLY): 16 min   Charges:   PT Evaluation $PT Eval Low Complexity: 1 Low     PT G Codes:        [place] PT [phone]   Rada Hay 05/11/2018, 5:45 PM

## 2018-05-11 NOTE — Anesthesia Preprocedure Evaluation (Signed)
Anesthesia Evaluation  Patient identified by MRN, date of birth, ID band Patient awake    Reviewed: Allergy & Precautions, NPO status , Patient's Chart, lab work & pertinent test results  Airway Mallampati: II  TM Distance: >3 FB Neck ROM: Full    Dental  (+) Teeth Intact, Dental Advisory Given   Pulmonary neg pulmonary ROS,    Pulmonary exam normal breath sounds clear to auscultation       Cardiovascular hypertension, Normal cardiovascular exam Rhythm:Regular Rate:Normal     Neuro/Psych PSYCHIATRIC DISORDERS Anxiety Depression negative neurological ROS     GI/Hepatic Neg liver ROS, hiatal hernia, GERD  Medicated,  Endo/Other  negative endocrine ROSObesity   Renal/GU Renal InsufficiencyRenal disease     Musculoskeletal  (+) Arthritis , Osteoarthritis,    Abdominal   Peds  Hematology  (+) Blood dyscrasia, anemia , Plt 353k   Anesthesia Other Findings Day of surgery medications reviewed with the patient.  Reproductive/Obstetrics                             Anesthesia Physical Anesthesia Plan  ASA: II  Anesthesia Plan: Spinal   Post-op Pain Management:    Induction: Intravenous  PONV Risk Score and Plan: 2 and Propofol infusion and Treatment may vary due to age or medical condition  Airway Management Planned: Simple Face Mask and Natural Airway  Additional Equipment:   Intra-op Plan:   Post-operative Plan:   Informed Consent: I have reviewed the patients History and Physical, chart, labs and discussed the procedure including the risks, benefits and alternatives for the proposed anesthesia with the patient or authorized representative who has indicated his/her understanding and acceptance.   Dental advisory given  Plan Discussed with: CRNA, Anesthesiologist and Surgeon  Anesthesia Plan Comments:         Anesthesia Quick Evaluation

## 2018-05-11 NOTE — Interval H&P Note (Signed)
History and Physical Interval Note:  05/11/2018 8:43 AM  [name]  has presented today for surgery, with the diagnosis of Left knee osteoarthritis  The various methods of treatment have been discussed with the patient and family. After consideration of risks, benefits and other options for treatment, the patient has consented to  Procedure(s) with comments: LEFT TOTAL KNEE ARTHROPLASTY (Left) - 70 mins as a surgical intervention .  The patient's history has been reviewed, patient examined, no change in status, stable for surgery.  I have reviewed the patient's chart and labs.  Questions were answered to the patient's satisfaction.     Shelda PalMatthew D Atianna Haidar

## 2018-05-11 NOTE — Op Note (Signed)
NAME:  Amanda Taylor                      MEDICAL RECORD NO.:  [id number]                             FACILITY:  [facility]      PHYSICIAN:  Madlyn Frankel. Charlann Boxer, M.D.  DATE OF BIRTH:  09-17-1966      DATE OF PROCEDURE:  05/11/2018                                     OPERATIVE REPORT         PREOPERATIVE DIAGNOSIS:  Left knee osteoarthritis.      POSTOPERATIVE DIAGNOSIS:  Left knee osteoarthritis.      FINDINGS:  The patient was noted to have complete loss of cartilage and   bone-on-bone arthritis with associated osteophytes in the medial and patellofemoral compartments of   the knee with a significant synovitis and associated effusion.  The patient had failed months of conservative treatment including medications, injection therapy, activity modification.     PROCEDURE:  Left total knee replacement.      COMPONENTS USED:  DePuy Attune rotating platform posterior stabilized knee   system, a size 4 femur, 4 tibia, size 6 mm PS AOX insert, and 35 anatomic patellar   button.      SURGEON:  Madlyn Frankel. Charlann Boxer, M.D.      ASSISTANT:  Lanney Gins, PA-C.      ANESTHESIA:  Regional and Spinal.      SPECIMENS:  None.      COMPLICATION:  None.      DRAINS:  None.  EBL: <100cc      TOURNIQUET TIME:   Total Tourniquet Time Documented: Thigh (Left) - 28 minutes Total: Thigh (Left) - 28 minutes     The patient was stable to the recovery room.      INDICATION FOR PROCEDURE:  Amanda Taylor is a 52 y.o. female patient of   mine.  The patient had been seen, evaluated, and treated for months conservatively in the   office with medication, activity modification, and injections.  The patient had   radiographic changes of bone-on-bone arthritis with endplate sclerosis and osteophytes noted.  Based on the radiographic changes and failed conservative measures, the patient   decided to proceed with definitive treatment, total knee replacement.  Risks of infection, DVT, component failure, need for  revision surgery, neurovascular injury were reviewed in the office setting.  The postop course was reviewed stressing the efforts to maximize post-operative satisfaction and function.  Consent was obtained for benefit of pain   relief.      PROCEDURE IN DETAIL:  The patient was brought to the operative theater.   Once adequate anesthesia, preoperative antibiotics, 2 gm of Ancef,1 gm of Tranexamic Acid, and 10 mg of Decadron administered, the patient was positioned supine with a left thigh tourniquet placed.  The  left lower extremity was prepped and draped in sterile fashion.  A time-   out was performed identifying the patient, planned procedure, and the appropriate extremity.      The left lower extremity was placed in the [facility] leg holder.  The leg was   exsanguinated, tourniquet elevated to 250 mmHg.  A midline incision was   made followed by median  parapatellar arthrotomy.  Following initial   exposure, attention was first directed to the patella.  Precut   measurement was noted to be 22 mm.  I resected down to 13-14 mm and used a   35 anatomic patellar button to restore patellar height as well as cover the cut surface.      The lug holes were drilled and a metal shim was placed to protect the   patella from retractors and saw blade during the procedure.      At this point, attention was now directed to the femur.  The femoral   canal was opened with a drill, irrigated to try to prevent fat emboli.  An   intramedullary rod was passed at 3 degrees valgus, 9 mm of bone was   resected off the distal femur.  Following this resection, the tibia was   subluxated anteriorly.  Using the extramedullary guide, 2 mm of bone was resected off   the proximal medial tibia.  We confirmed the gap would be   stable medially and laterally with a size 5 spacer block as well as confirmed that the tibial cut was perpendicular in the coronal plane, checking with an alignment rod.      Once this was done, I  sized the femur to be a size 4 in the anterior-   posterior dimension, chose a standard component based on medial and   lateral dimension.  The size 4 rotation block was then pinned in   position anterior referenced using the C-clamp to set rotation.  The   anterior, posterior, and  chamfer cuts were made without difficulty nor   notching making certain that I was along the anterior cortex to help   with flexion gap stability.      The final box cut was made off the lateral aspect of distal femur.      At this point, the tibia was sized to be a size 4.  The size 4 tray was   then pinned in position through the medial third of the tubercle,   drilled, and keel punched.  Trial reduction was now carried with a 4 femur,  4 tibia, a size 6 mm PS insert, and the 35 anatomic patella botton.  The knee was brought to full extension with good flexion stability with the patella   tracking through the trochlea without application of pressure.  Given   all these findings the trial components removed.  Final components were   opened and cement was mixed.  The knee was irrigated with normal saline solution and pulse lavage.  The synovial lining was   then injected with 30 cc of 0.25% Marcaine with epinephrine, 1 cc of Toradol and 30 cc of NS for a total of 61 cc.     Final implants were then cemented onto cleaned and dried cut surfaces of bone with the knee brought to extension with a size 6 mm PS trial insert.      Once the cement had fully cured, excess cement was removed   throughout the knee.  I confirmed that I was satisfied with the range of   motion and stability, and the final size 6 mm PS AOX insert was chosen.  It was   placed into the knee.      The tourniquet had been let down at 28 minutes.  No significant   hemostasis was required.  The extensor mechanism was then reapproximated using #1 Vicryl and #1 Stratafix sutures  with the knee   in flexion.  The   remaining wound was closed with 2-0  Vicryl and running 4-0 Monocryl.   The knee was cleaned, dried, dressed sterilely using Dermabond and   Aquacel dressing.  The patient was then   brought to recovery room in stable condition, tolerating the procedure   well.   Please note that Physician Assistant, Lanney GinsMatthew Babish, PA-C was present for the entirety of the case, and was utilized for pre-operative positioning, peri-operative retractor management, general facilitation of the procedure and for primary wound closure at the end of the case.              Madlyn FrankelMatthew D. Charlann Boxerlin, M.D.    05/11/2018 11:26 AM

## 2018-05-11 NOTE — Anesthesia Postprocedure Evaluation (Signed)
Anesthesia Post Note  Patient: Amanda Taylor  Procedure(s) Performed: LEFT TOTAL KNEE ARTHROPLASTY (Left Knee)     Patient location during evaluation: PACU Anesthesia Type: Spinal Level of consciousness: oriented and awake and alert Pain management: pain level controlled Vital Signs Assessment: post-procedure vital signs reviewed and stable Respiratory status: spontaneous breathing, respiratory function stable and patient connected to nasal cannula oxygen Cardiovascular status: blood pressure returned to baseline and stable Postop Assessment: no headache, no backache, no apparent nausea or vomiting, patient able to bend at knees and spinal receding Anesthetic complications: no    Last Vitals:  Vitals:   05/11/18 1352 05/11/18 1459  BP: (!) 130/91 113/87  Pulse: 86 95  Resp: 15 14  Temp: 36.5 C   SpO2: 100% 100%    Last Pain:  Vitals:   05/11/18 1400  TempSrc:   PainSc: 0-No pain                 Cecile HearingStephen Edward Turk

## 2018-05-11 NOTE — Discharge Instructions (Signed)

## 2018-05-11 NOTE — Anesthesia Procedure Notes (Signed)
Spinal  Patient location during procedure: OR Start time: 05/11/2018 10:01 AM End time: 05/11/2018 10:04 AM Staffing Anesthesiologist: Cecile Hearingurk, Lyric Rossano Edward, MD Performed: anesthesiologist  Preanesthetic Checklist Completed: patient identified, surgical consent, pre-op evaluation, timeout performed, IV checked, risks and benefits discussed and monitors and equipment checked Spinal Block Patient position: sitting Prep: site prepped and draped and DuraPrep Patient monitoring: continuous pulse ox and blood pressure Approach: midline Location: L3-4 Injection technique: single-shot Needle Needle type: Pencan  Needle gauge: 24 G Additional Notes Functioning IV was confirmed and monitors were applied. Sterile prep and drape, including hand hygiene, mask and sterile gloves were used. The patient was positioned and the spine was prepped. The skin was anesthetized with lidocaine.  Free flow of clear CSF was obtained prior to injecting local anesthetic into the CSF.  The spinal needle aspirated freely following injection.  The needle was carefully withdrawn.  The patient tolerated the procedure well. Consent was obtained prior to procedure with all questions answered and concerns addressed. Risks including but not limited to bleeding, infection, nerve damage, paralysis, failed block, inadequate analgesia, allergic reaction, high spinal, itching and headache were discussed and the patient wished to proceed.   Arrie AranStephen Fayette Hamada, MD

## 2018-05-11 NOTE — Progress Notes (Signed)
AssistedDr. Turk with left, ultrasound guided, adductor canal block. Side rails up, monitors on throughout procedure. See vital signs in flow sheet. Tolerated Procedure well.  

## 2018-05-11 NOTE — Transfer of Care (Signed)
Immediate Anesthesia Transfer of Care Note  Patient: Amanda Taylor  Procedure(s) Performed: Procedure(s) with comments: LEFT TOTAL KNEE ARTHROPLASTY (Left) - 70 mins  Patient Location: PACU  Anesthesia Type:Spinal  Level of Consciousness:  sedated, patient cooperative and responds to stimulation  Airway & Oxygen Therapy:Patient Spontanous Breathing and Patient connected to face mask oxgen  Post-op Assessment:  Report given to PACU RN and Post -op Vital signs reviewed and stable  Post vital signs:  Reviewed and stable  Last Vitals:  Vitals:   05/11/18 0932 05/11/18 0937  BP: 121/82 107/71  Pulse: 80 81  Resp: 18 16  Temp:    SpO2: 95% 94%    Complications: No apparent anesthesia complications

## 2018-05-12 ENCOUNTER — Encounter (HOSPITAL_COMMUNITY): Payer: Self-pay | Admitting: Orthopedic Surgery

## 2018-05-12 DIAGNOSIS — K5909 Other constipation: Secondary | ICD-10-CM | POA: Diagnosis not present

## 2018-05-12 DIAGNOSIS — Z6837 Body mass index (BMI) 37.0-37.9, adult: Secondary | ICD-10-CM | POA: Diagnosis not present

## 2018-05-12 DIAGNOSIS — M1712 Unilateral primary osteoarthritis, left knee: Secondary | ICD-10-CM | POA: Diagnosis not present

## 2018-05-12 DIAGNOSIS — K219 Gastro-esophageal reflux disease without esophagitis: Secondary | ICD-10-CM | POA: Diagnosis not present

## 2018-05-12 DIAGNOSIS — I10 Essential (primary) hypertension: Secondary | ICD-10-CM | POA: Diagnosis not present

## 2018-05-12 DIAGNOSIS — Z79899 Other long term (current) drug therapy: Secondary | ICD-10-CM | POA: Diagnosis not present

## 2018-05-12 DIAGNOSIS — Z7982 Long term (current) use of aspirin: Secondary | ICD-10-CM | POA: Diagnosis not present

## 2018-05-12 DIAGNOSIS — F419 Anxiety disorder, unspecified: Secondary | ICD-10-CM | POA: Diagnosis not present

## 2018-05-12 DIAGNOSIS — E669 Obesity, unspecified: Secondary | ICD-10-CM | POA: Diagnosis not present

## 2018-05-12 DIAGNOSIS — F329 Major depressive disorder, single episode, unspecified: Secondary | ICD-10-CM | POA: Diagnosis not present

## 2018-05-12 LAB — CBC
HCT: 28.6 % — ABNORMAL LOW (ref 36.0–46.0)
Hemoglobin: 9.6 g/dL — ABNORMAL LOW (ref 12.0–15.0)
MCH: 24.9 pg — ABNORMAL LOW (ref 26.0–34.0)
MCHC: 33.6 g/dL (ref 30.0–36.0)
MCV: 74.3 fL — AB (ref 78.0–100.0)
PLATELETS: 350 10*3/uL (ref 150–400)
RBC: 3.85 MIL/uL — ABNORMAL LOW (ref 3.87–5.11)
RDW: 16 % — AB (ref 11.5–15.5)
WBC: 17.7 10*3/uL — AB (ref 4.0–10.5)

## 2018-05-12 LAB — BASIC METABOLIC PANEL
Anion gap: 6 (ref 5–15)
BUN: 16 mg/dL (ref 6–20)
CALCIUM: 8.8 mg/dL — AB (ref 8.9–10.3)
CO2: 28 mmol/L (ref 22–32)
Chloride: 103 mmol/L (ref 101–111)
Creatinine, Ser: 1.27 mg/dL — ABNORMAL HIGH (ref 0.44–1.00)
GFR calc Af Amer: 55 mL/min — ABNORMAL LOW (ref >60)
GFR, EST NON AFRICAN AMERICAN: 48 mL/min — AB (ref >60)
GLUCOSE: 112 mg/dL — AB (ref 65–99)
Potassium: 4 mmol/L (ref 3.5–5.1)
Sodium: 137 mmol/L (ref 135–145)

## 2018-05-12 MED ORDER — DOCUSATE SODIUM 100 MG PO CAPS: 100.0000 mg | ORAL_CAPSULE | Freq: Two times a day (BID) | ORAL | 0 refills | Status: DC

## 2018-05-12 MED ORDER — ASPIRIN 81 MG PO CHEW: 81.0000 mg | CHEWABLE_TABLET | Freq: Two times a day (BID) | ORAL | 0 refills | Status: AC

## 2018-05-12 MED ORDER — LIP MEDEX EX OINT
TOPICAL_OINTMENT | CUTANEOUS | Status: AC
  Administered 2018-05-12: 09:00:00
  Filled 2018-05-12: qty 7

## 2018-05-12 MED ORDER — POLYETHYLENE GLYCOL 3350 17 G PO PACK: 17.0000 g | PACK | Freq: Two times a day (BID) | ORAL | 0 refills | Status: DC

## 2018-05-12 MED ORDER — LIP MEDEX EX OINT
TOPICAL_OINTMENT | CUTANEOUS | Status: AC
  Administered 2018-05-12: 11:00:00
  Filled 2018-05-12: qty 7

## 2018-05-12 MED ORDER — OXYCODONE HCL 10 MG PO TABS: 10.0000 mg | ORAL_TABLET | ORAL | 0 refills | Status: AC | PRN

## 2018-05-12 MED ORDER — ACETAMINOPHEN 500 MG PO TABS: 1000.0000 mg | ORAL_TABLET | Freq: Three times a day (TID) | ORAL | 0 refills | Status: AC

## 2018-05-12 MED ORDER — PANTOPRAZOLE SODIUM 40 MG PO TBEC: 40.0000 mg | DELAYED_RELEASE_TABLET | Freq: Every morning | ORAL | 0 refills | Status: AC

## 2018-05-12 MED ORDER — CYCLOBENZAPRINE HCL 10 MG PO TABS: 10.0000 mg | ORAL_TABLET | Freq: Three times a day (TID) | ORAL | 0 refills | Status: DC | PRN

## 2018-05-12 MED ORDER — FERROUS SULFATE 325 (65 FE) MG PO TABS: 325.0000 mg | ORAL_TABLET | Freq: Three times a day (TID) | ORAL | 3 refills | Status: DC

## 2018-05-12 NOTE — Progress Notes (Signed)
     Subjective: 1 Day Post-Op Procedure(s) (LRB): LEFT TOTAL KNEE ARTHROPLASTY (Left)   Seen by Dr. Charlann Boxerlin. Patient reports pain as mild, pain controlled. No events throughout the night. Looking forward to progressing with PT.  Ready to be discharged home if she does well with PT.   Patient's anticipated LOS is less than 2 midnights, meeting these requirements: - Younger than 6365 - Lives within 1 hour of care - Has a competent adult at home to recover with post-op recover - NO history of  - Chronic pain requiring opiods  - Diabetes  - Coronary Artery Disease  - Heart failure  - Heart attack  - Stroke  - DVT/VTE  - Cardiac arrhythmia  - Respiratory Failure/COPD  - Renal failure  - Anemia  - Advanced Liver disease       Objective:   VITALS:   Vitals:   05/11/18 2154 05/12/18 0446  BP: 113/75 127/87  Pulse: (!) 102 100  Resp: 16 16  Temp: 97.6 F (36.4 C) (!) 97.4 F (36.3 C)  SpO2: 100% 100%    Dorsiflexion/Plantar flexion intact Incision: dressing C/D/I No cellulitis present Compartment soft  LABS Recent Labs    05/09/18 1018 05/12/18 0549  HGB 10.2* 9.6*  HCT 30.4* 28.6*  WBC 11.7* 17.7*  PLT 353 350    Recent Labs    05/09/18 1018 05/12/18 0549  NA 137 137  K 3.6 4.0  BUN 15 16  CREATININE 1.24* 1.27*  GLUCOSE 80 112*     Assessment/Plan: 1 Day Post-Op Procedure(s) (LRB): LEFT TOTAL KNEE ARTHROPLASTY (Left) Advance diet Up with therapy D/C IV fluids Discharge home [facility] Follow up with OLIN,Yussef Jorge D in 2 weeks.  Contact information:  [facility]) [address], [address] [place] [id number] [phone]    Obese (BMI 30-39.9) Estimated body mass index is 37.77 kg/m as calculated from the following:   Height as of this encounter: 5\' 5"  (1.651 m).   Weight as of this encounter: 103 kg (227 lb). Patient also counseled that  weight may inhibit the healing process Patient counseled that losing weight will help with future health issues         Anastasio AuerbachMatthew S. Luan Maberry   PAC  05/12/2018, 8:31 AM

## 2018-05-12 NOTE — Progress Notes (Signed)
Physical Therapy Treatment Patient Details Name: Amanda Taylor MRN: [id number] DOB: 06-Jul-1966 Today's Date: 05/12/2018    History of Present Illness s/P LTKA, R TKA 4/19    PT Comments    The  Patient is progressing well. Pleased with lack of pain compared to last TKA   Follow Up Recommendations  Follow surgeon's recommendation for DC plan and follow-up therapies;Home health PT     Equipment Recommendations  None recommended by PT    Recommendations for Other Services       Precautions / Restrictions Precautions Precautions: Fall;Knee    Mobility  Bed Mobility Overal bed mobility: Independent                Transfers   Equipment used: Rolling walker (2 wheeled) Transfers: Sit to/from Stand Sit to Stand: Supervision         General transfer comment: cues for safety due to impulsivity  Ambulation/Gait Ambulation/Gait assistance: Supervision Gait Distance (Feet): 400 Feet Assistive device: Rolling walker (2 wheeled) Gait Pattern/deviations: Step-to pattern     General Gait Details: cues for safety   Stairs Stairs: Yes Stairs assistance: Min guard Stair Management: One rail Left;Step to pattern;Forwards;With crutches Number of Stairs: 6 General stair comments: cues for safety and sequence, did reciprocal x 2 steps.   Wheelchair Mobility    Modified Rankin (Stroke Patients Only)       Balance                                            Cognition Arousal/Alertness: Awake/alert                                            Exercises Total Joint Exercises Ankle Circles/Pumps: AROM;Both;10 reps Quad Sets: AROM;Both Short Arc Quad: AROM;Left;10 reps Heel Slides: AROM;Left;10 reps Hip ABduction/ADduction: AROM;Left;10 reps Straight Leg Raises: AROM;Left;10 reps Goniometric ROM: 5-100 left knee flex    General Comments        Pertinent Vitals/Pain Pain Score: 5  Pain Location: left knee Pain  Descriptors / Indicators: Discomfort Pain Intervention(s): Monitored during session;Premedicated before session;Ice applied    Home Living                      Prior Function            PT Goals (current goals can now be found in the care plan section) Progress towards PT goals: Progressing toward goals    Frequency    7X/week      PT Plan Current plan remains appropriate    Co-evaluation              AM-PAC PT "6 Clicks" Daily Activity  Outcome Measure  Difficulty turning over in bed (including adjusting bedclothes, sheets and blankets)?: None   Difficulty sitting down on and standing up from a chair with arms (e.g., wheelchair, bedside commode, etc,.)?: A Little Help needed moving to and from a bed to chair (including a wheelchair)?: A Little Help needed walking in hospital room?: A Little Help needed climbing 3-5 steps with a railing? : A Little 6 Click Score: 16    End of Session   Activity Tolerance: Patient tolerated treatment well Patient left: in bed;with bed alarm set Nurse Communication:  Mobility status PT Visit Diagnosis: Unsteadiness on feet (R26.81);Pain Pain - Right/Left: Left Pain - part of body: Knee     Time: [id number] PT Time Calculation (min) (ACUTE ONLY): 38 min  Charges:  $Gait Training: 8-22 mins $Therapeutic Exercise: 8-22 mins $Self Care/Home Management: 8-22                    G Codes:          Rada HayHill, Amanda Taylor Elizabeth 05/12/2018, 1:15 PM

## 2018-05-12 NOTE — Progress Notes (Signed)
Patient discharged to home with family. Given all belongings, instructions, prescriptions. Patient verbalized understanding of all instructions. Escorted to pov via w/c. 

## 2018-05-12 NOTE — Progress Notes (Signed)
Patient tearful, reports that Dr Charlann Boxerlin spoke to her about her kidney function and patient is anxious about possibility of kidney disease/failure. Offered to have Dr return to speak with her and answer any questions, patient declined. Suggested to patient to follow up with her primary care provider once d/c from hospital to monitor. Patient verbalized understanding.

## 2018-05-16 NOTE — Discharge Summary (Signed)
Physician Discharge Summary  Patient ID: Amanda Taylor MRN: [id number] DOB/AGE: 12/19/1965 52 y.o.  Admit date: 05/11/2018 Discharge date: 05/12/2018   Procedures:  Procedure(s) (LRB): LEFT TOTAL KNEE ARTHROPLASTY (Left)  Attending Physician:  Dr. Durene RomansMatthew Olin   Admission Diagnoses:   Left knee primary OA / pain  Discharge Diagnoses:  Principal Problem:   S/P left TKA Active Problems:   Class 2 obesity without serious comorbidity with body mass index (BMI) of 39.0 to 39.9 in adult  Past Medical History:  Diagnosis Date  . Anxiety   . Chronic constipation   . Chronic low back pain    mobic---- numbnes down right leg from hip occasionally  . Depression   . Family history of adverse reaction to anesthesia    mother-- ponv and sister PONV  . GERD (gastroesophageal reflux disease)   . Hiatal hernia   . Hypertension   . Osteoarthritis of knee    right and left  . Seasonal allergies     HPI:    Amanda Grandchildvelyn M Enriquez, 52 y.o. female, has a history of pain and functional disability in the left knee due to arthritis and has failed non-surgical conservative treatments for greater than 12 weeks to include NSAID's and/or analgesics, corticosteriod injections and activity modification.  Onset of symptoms was gradual, starting 1+ years ago with gradually worsening course since that time. The patient noted prior procedures on the knee to include  arthroplasty on the right knee per Dr. Charlann Boxerlin on  02/27/2018.  Patient currently rates pain in the left knee(s) at 10 out of 10 with activity. Patient has night pain, worsening of pain with activity and weight bearing, pain that interferes with activities of daily living, pain with passive range of motion, crepitus and joint swelling.  Patient has evidence of periarticular osteophytes and joint space narrowing by imaging studies.  There is no active infection.  Risks, benefits and expectations were discussed with the patient.  Risks including but not limited  to the risk of anesthesia, blood clots, nerve damage, blood vessel damage, failure of the prosthesis, infection and up to and including death.  Patient understand the risks, benefits and expectations and wishes to proceed with surgery.   PCP: Toma DeitersHasanaj, Xaje A, MD   Discharged Condition: good  Hospital Course:  Patient underwent the above stated procedure on 05/11/2018. Patient tolerated the procedure well and brought to the recovery room in good condition and subsequently to the floor.  POD #1 BP: 127/87 ; Pulse: 100 ; Temp: 97.4 F (36.3 C) ; Resp: 16 Patient reports pain as mild, pain controlled. No events throughout the night. Looking forward to progressing with PT.  Ready to be discharged home. Dorsiflexion/plantar flexion intact, incision: dressing C/D/I, no cellulitis present and compartment soft.   LABS  Basename    HGB     9.6  HCT     28.6    Discharge Exam: General appearance: alert, cooperative and no distress Extremities: Homans sign is negative, no sign of DVT, no edema, redness or tenderness in the calves or thighs and no ulcers, gangrene or trophic changes  Disposition:  Home with follow up in 2 weeks   Follow-up Information    Durene Romanslin, Zaya Kessenich, MD. Schedule an appointment as soon as possible for a visit in 2 weeks.   Specialty:  Orthopedic Surgery Contact information: [address] [place] [id number] [phone]           Discharge Instructions    Call  MD / Call 911   Complete by:  As directed    If you experience chest pain or shortness of breath, CALL 911 and be transported to the hospital emergency room.  If you develope a fever above 101 F, pus (white drainage) or increased drainage or redness at the wound, or calf pain, call your surgeon's office.   Change dressing   Complete by:  As directed    Maintain surgical dressing until follow up in the clinic. If the edges start to pull up, may reinforce with tape. If the dressing is no longer  working, may remove and cover with gauze and tape, but must keep the area dry and clean.  Call with any questions or concerns.   Constipation Prevention   Complete by:  As directed    Drink plenty of fluids.  Prune juice may be helpful.  You may use a stool softener, such as Colace (over the counter) 100 mg twice a day.  Use MiraLax (over the counter) for constipation as needed.   Diet - low sodium heart healthy   Complete by:  As directed    Discharge instructions   Complete by:  As directed    Maintain surgical dressing until follow up in the clinic. If the edges start to pull up, may reinforce with tape. If the dressing is no longer working, may remove and cover with gauze and tape, but must keep the area dry and clean.  Follow up in 2 weeks at [facility]. Call with any questions or concerns.   Increase activity slowly as tolerated   Complete by:  As directed    Weight bearing as tolerated with assist device (walker, cane, etc) as directed, use it as long as suggested by your surgeon or therapist, typically at least 4-6 weeks.   TED hose   Complete by:  As directed    Use stockings (TED hose) for 2 weeks on both leg(s).  You may remove them at night for sleeping.      Allergies as of 05/12/2018      Reactions   Elavil [amitriptyline] Other (See Comments)   Urine retention   Topamax [topiramate] Other (See Comments)   Numbness in fingertips      Medication List    STOP taking these medications   aspirin EC 81 MG tablet Replaced by:  aspirin 81 MG chewable tablet   Esomeprazole Magnesium 20 MG Tbec Replaced by:  pantoprazole 40 MG tablet   meloxicam 15 MG tablet Commonly known as:  MOBIC   oxyCODONE-acetaminophen 10-325 MG tablet Commonly known as:  PERCOCET     TAKE these medications   acetaminophen 500 MG tablet Commonly known as:  TYLENOL Take 2 tablets (1,000 mg total) by mouth every 8 (eight) hours.   ALPRAZolam 1 MG tablet Commonly known as:   XANAX Take 1 mg by mouth 2 (two) times daily as needed for anxiety.   aspirin 81 MG chewable tablet Commonly known as:  ASPIRIN CHILDRENS Chew 1 tablet (81 mg total) by mouth 2 (two) times daily. Take for 4 weeks, then resume regular dose. Replaces:  aspirin EC 81 MG tablet   cyclobenzaprine 10 MG tablet Commonly known as:  FLEXERIL Take 1 tablet (10 mg total) by mouth 3 (three) times daily as needed for muscle spasms.   docusate sodium 100 MG capsule Commonly known as:  COLACE Take 1 capsule (100 mg total) by mouth 2 (two) times daily.   ferrous sulfate 325 (65 FE)  MG tablet Commonly known as:  FERROUSUL Take 1 tablet (325 mg total) by mouth 3 (three) times daily with meals.   FLUoxetine 40 MG capsule Commonly known as:  PROZAC Take 40 mg by mouth every morning.   HAIR SKIN AND NAILS FORMULA Tabs Take 1 tablet by mouth daily.   lidocaine 5 % Commonly known as:  LIDODERM Place 1 patch onto the skin daily as needed for pain.   LYSINE PO Take 500 mg by mouth daily.   Oxycodone HCl 10 MG Tabs Take 1-2 tablets (10-20 mg total) by mouth every 4 (four) hours as needed for up to 5 days for moderate pain or severe pain.   pantoprazole 40 MG tablet Commonly known as:  PROTONIX Take 1 tablet (40 mg total) by mouth every morning. Replaces:  Esomeprazole Magnesium 20 MG Tbec   polyethylene glycol packet Commonly known as:  MIRALAX / GLYCOLAX Take 17 g by mouth 2 (two) times daily.   promethazine 25 MG tablet Commonly known as:  PHENERGAN Take 25 mg by mouth every 6 (six) hours as needed for nausea or vomiting.   triamterene-hydrochlorothiazide 75-50 MG tablet Commonly known as:  MAXZIDE Take 1 tablet by mouth every morning.   ZZZQUIL 25 MG Caps Generic drug:  diphenhydrAMINE HCl (Sleep) Take 25-50 mg by mouth at bedtime as needed (sleep).            Discharge Care Instructions  (From admission, onward)        Start     Ordered   05/12/18 0000  Change dressing     Comments:  Maintain surgical dressing until follow up in the clinic. If the edges start to pull up, may reinforce with tape. If the dressing is no longer working, may remove and cover with gauze and tape, but must keep the area dry and clean.  Call with any questions or concerns.   05/12/18 1914       Signed: Anastasio Auerbach. Mahsa Hanser   PA-C  05/16/2018, 12:46 PM

## 2018-05-17 DIAGNOSIS — M25662 Stiffness of left knee, not elsewhere classified: Secondary | ICD-10-CM | POA: Diagnosis not present

## 2018-05-17 DIAGNOSIS — M25562 Pain in left knee: Secondary | ICD-10-CM | POA: Diagnosis not present

## 2018-05-17 DIAGNOSIS — R531 Weakness: Secondary | ICD-10-CM | POA: Diagnosis not present

## 2018-05-17 DIAGNOSIS — Z96652 Presence of left artificial knee joint: Secondary | ICD-10-CM | POA: Diagnosis not present

## 2018-05-18 DIAGNOSIS — M25662 Stiffness of left knee, not elsewhere classified: Secondary | ICD-10-CM | POA: Diagnosis not present

## 2018-05-18 DIAGNOSIS — M25562 Pain in left knee: Secondary | ICD-10-CM | POA: Diagnosis not present

## 2018-05-18 DIAGNOSIS — R531 Weakness: Secondary | ICD-10-CM | POA: Diagnosis not present

## 2018-05-18 DIAGNOSIS — Z96652 Presence of left artificial knee joint: Secondary | ICD-10-CM | POA: Diagnosis not present

## 2018-05-22 DIAGNOSIS — M25662 Stiffness of left knee, not elsewhere classified: Secondary | ICD-10-CM | POA: Diagnosis not present

## 2018-05-22 DIAGNOSIS — R531 Weakness: Secondary | ICD-10-CM | POA: Diagnosis not present

## 2018-05-22 DIAGNOSIS — M25562 Pain in left knee: Secondary | ICD-10-CM | POA: Diagnosis not present

## 2018-05-22 DIAGNOSIS — Z96652 Presence of left artificial knee joint: Secondary | ICD-10-CM | POA: Diagnosis not present

## 2018-05-24 DIAGNOSIS — Z96652 Presence of left artificial knee joint: Secondary | ICD-10-CM | POA: Diagnosis not present

## 2018-05-24 DIAGNOSIS — R531 Weakness: Secondary | ICD-10-CM | POA: Diagnosis not present

## 2018-05-24 DIAGNOSIS — M25562 Pain in left knee: Secondary | ICD-10-CM | POA: Diagnosis not present

## 2018-05-24 DIAGNOSIS — M25662 Stiffness of left knee, not elsewhere classified: Secondary | ICD-10-CM | POA: Diagnosis not present

## 2018-05-25 DIAGNOSIS — J301 Allergic rhinitis due to pollen: Secondary | ICD-10-CM | POA: Diagnosis not present

## 2018-05-25 DIAGNOSIS — Z6841 Body Mass Index (BMI) 40.0 and over, adult: Secondary | ICD-10-CM | POA: Diagnosis not present

## 2018-05-25 DIAGNOSIS — F330 Major depressive disorder, recurrent, mild: Secondary | ICD-10-CM | POA: Diagnosis not present

## 2018-05-25 DIAGNOSIS — I10 Essential (primary) hypertension: Secondary | ICD-10-CM | POA: Diagnosis not present

## 2018-05-25 DIAGNOSIS — Z6835 Body mass index (BMI) 35.0-35.9, adult: Secondary | ICD-10-CM | POA: Diagnosis not present

## 2018-05-29 DIAGNOSIS — M25562 Pain in left knee: Secondary | ICD-10-CM | POA: Diagnosis not present

## 2018-05-29 DIAGNOSIS — Z96652 Presence of left artificial knee joint: Secondary | ICD-10-CM | POA: Diagnosis not present

## 2018-05-29 DIAGNOSIS — M25662 Stiffness of left knee, not elsewhere classified: Secondary | ICD-10-CM | POA: Diagnosis not present

## 2018-05-29 DIAGNOSIS — R531 Weakness: Secondary | ICD-10-CM | POA: Diagnosis not present

## 2018-06-02 DIAGNOSIS — R531 Weakness: Secondary | ICD-10-CM | POA: Diagnosis not present

## 2018-06-02 DIAGNOSIS — Z96652 Presence of left artificial knee joint: Secondary | ICD-10-CM | POA: Diagnosis not present

## 2018-06-02 DIAGNOSIS — M25662 Stiffness of left knee, not elsewhere classified: Secondary | ICD-10-CM | POA: Diagnosis not present

## 2018-06-02 DIAGNOSIS — M25562 Pain in left knee: Secondary | ICD-10-CM | POA: Diagnosis not present

## 2018-06-05 DIAGNOSIS — M25662 Stiffness of left knee, not elsewhere classified: Secondary | ICD-10-CM | POA: Diagnosis not present

## 2018-06-05 DIAGNOSIS — Z96652 Presence of left artificial knee joint: Secondary | ICD-10-CM | POA: Diagnosis not present

## 2018-06-05 DIAGNOSIS — R531 Weakness: Secondary | ICD-10-CM | POA: Diagnosis not present

## 2018-06-05 DIAGNOSIS — M25562 Pain in left knee: Secondary | ICD-10-CM | POA: Diagnosis not present

## 2018-06-07 DIAGNOSIS — M25562 Pain in left knee: Secondary | ICD-10-CM | POA: Diagnosis not present

## 2018-06-07 DIAGNOSIS — R531 Weakness: Secondary | ICD-10-CM | POA: Diagnosis not present

## 2018-06-07 DIAGNOSIS — Z96652 Presence of left artificial knee joint: Secondary | ICD-10-CM | POA: Diagnosis not present

## 2018-06-07 DIAGNOSIS — M25662 Stiffness of left knee, not elsewhere classified: Secondary | ICD-10-CM | POA: Diagnosis not present

## 2018-06-09 DIAGNOSIS — M25662 Stiffness of left knee, not elsewhere classified: Secondary | ICD-10-CM | POA: Diagnosis not present

## 2018-06-09 DIAGNOSIS — M25562 Pain in left knee: Secondary | ICD-10-CM | POA: Diagnosis not present

## 2018-06-09 DIAGNOSIS — Z96652 Presence of left artificial knee joint: Secondary | ICD-10-CM | POA: Diagnosis not present

## 2018-06-09 DIAGNOSIS — R531 Weakness: Secondary | ICD-10-CM | POA: Diagnosis not present

## 2018-06-12 DIAGNOSIS — R531 Weakness: Secondary | ICD-10-CM | POA: Diagnosis not present

## 2018-06-12 DIAGNOSIS — Z96652 Presence of left artificial knee joint: Secondary | ICD-10-CM | POA: Diagnosis not present

## 2018-06-12 DIAGNOSIS — M25662 Stiffness of left knee, not elsewhere classified: Secondary | ICD-10-CM | POA: Diagnosis not present

## 2018-06-12 DIAGNOSIS — M25562 Pain in left knee: Secondary | ICD-10-CM | POA: Diagnosis not present

## 2018-06-14 DIAGNOSIS — Z96652 Presence of left artificial knee joint: Secondary | ICD-10-CM | POA: Diagnosis not present

## 2018-06-14 DIAGNOSIS — M25662 Stiffness of left knee, not elsewhere classified: Secondary | ICD-10-CM | POA: Diagnosis not present

## 2018-06-14 DIAGNOSIS — M25562 Pain in left knee: Secondary | ICD-10-CM | POA: Diagnosis not present

## 2018-06-14 DIAGNOSIS — R531 Weakness: Secondary | ICD-10-CM | POA: Diagnosis not present

## 2018-06-16 DIAGNOSIS — M25562 Pain in left knee: Secondary | ICD-10-CM | POA: Diagnosis not present

## 2018-06-16 DIAGNOSIS — M25662 Stiffness of left knee, not elsewhere classified: Secondary | ICD-10-CM | POA: Diagnosis not present

## 2018-06-16 DIAGNOSIS — R531 Weakness: Secondary | ICD-10-CM | POA: Diagnosis not present

## 2018-06-16 DIAGNOSIS — Z96652 Presence of left artificial knee joint: Secondary | ICD-10-CM | POA: Diagnosis not present

## 2018-06-19 DIAGNOSIS — R531 Weakness: Secondary | ICD-10-CM | POA: Diagnosis not present

## 2018-06-19 DIAGNOSIS — Z96652 Presence of left artificial knee joint: Secondary | ICD-10-CM | POA: Diagnosis not present

## 2018-06-19 DIAGNOSIS — M25562 Pain in left knee: Secondary | ICD-10-CM | POA: Diagnosis not present

## 2018-06-19 DIAGNOSIS — M25662 Stiffness of left knee, not elsewhere classified: Secondary | ICD-10-CM | POA: Diagnosis not present

## 2018-06-21 DIAGNOSIS — M25662 Stiffness of left knee, not elsewhere classified: Secondary | ICD-10-CM | POA: Diagnosis not present

## 2018-06-21 DIAGNOSIS — M25562 Pain in left knee: Secondary | ICD-10-CM | POA: Diagnosis not present

## 2018-06-21 DIAGNOSIS — Z96652 Presence of left artificial knee joint: Secondary | ICD-10-CM | POA: Diagnosis not present

## 2018-06-21 DIAGNOSIS — R531 Weakness: Secondary | ICD-10-CM | POA: Diagnosis not present

## 2018-06-23 DIAGNOSIS — Z96651 Presence of right artificial knee joint: Secondary | ICD-10-CM | POA: Diagnosis not present

## 2018-06-23 DIAGNOSIS — Z471 Aftercare following joint replacement surgery: Secondary | ICD-10-CM | POA: Diagnosis not present

## 2018-06-23 DIAGNOSIS — Z96652 Presence of left artificial knee joint: Secondary | ICD-10-CM | POA: Diagnosis not present

## 2018-06-23 DIAGNOSIS — M25562 Pain in left knee: Secondary | ICD-10-CM | POA: Diagnosis not present

## 2018-06-23 DIAGNOSIS — R531 Weakness: Secondary | ICD-10-CM | POA: Diagnosis not present

## 2018-06-23 DIAGNOSIS — M25662 Stiffness of left knee, not elsewhere classified: Secondary | ICD-10-CM | POA: Diagnosis not present

## 2018-06-26 DIAGNOSIS — M25562 Pain in left knee: Secondary | ICD-10-CM | POA: Diagnosis not present

## 2018-06-26 DIAGNOSIS — Z96652 Presence of left artificial knee joint: Secondary | ICD-10-CM | POA: Diagnosis not present

## 2018-06-26 DIAGNOSIS — M25662 Stiffness of left knee, not elsewhere classified: Secondary | ICD-10-CM | POA: Diagnosis not present

## 2018-06-26 DIAGNOSIS — R531 Weakness: Secondary | ICD-10-CM | POA: Diagnosis not present

## 2018-07-03 DIAGNOSIS — R531 Weakness: Secondary | ICD-10-CM | POA: Diagnosis not present

## 2018-07-03 DIAGNOSIS — M25562 Pain in left knee: Secondary | ICD-10-CM | POA: Diagnosis not present

## 2018-07-03 DIAGNOSIS — Z96652 Presence of left artificial knee joint: Secondary | ICD-10-CM | POA: Diagnosis not present

## 2018-07-03 DIAGNOSIS — M25662 Stiffness of left knee, not elsewhere classified: Secondary | ICD-10-CM | POA: Diagnosis not present

## 2018-07-05 DIAGNOSIS — Z96652 Presence of left artificial knee joint: Secondary | ICD-10-CM | POA: Diagnosis not present

## 2018-07-05 DIAGNOSIS — R531 Weakness: Secondary | ICD-10-CM | POA: Diagnosis not present

## 2018-07-05 DIAGNOSIS — M25562 Pain in left knee: Secondary | ICD-10-CM | POA: Diagnosis not present

## 2018-07-05 DIAGNOSIS — M25662 Stiffness of left knee, not elsewhere classified: Secondary | ICD-10-CM | POA: Diagnosis not present

## 2018-07-10 DIAGNOSIS — M25662 Stiffness of left knee, not elsewhere classified: Secondary | ICD-10-CM | POA: Diagnosis not present

## 2018-07-10 DIAGNOSIS — R531 Weakness: Secondary | ICD-10-CM | POA: Diagnosis not present

## 2018-07-10 DIAGNOSIS — M25562 Pain in left knee: Secondary | ICD-10-CM | POA: Diagnosis not present

## 2018-07-10 DIAGNOSIS — Z96652 Presence of left artificial knee joint: Secondary | ICD-10-CM | POA: Diagnosis not present

## 2018-07-12 DIAGNOSIS — R531 Weakness: Secondary | ICD-10-CM | POA: Diagnosis not present

## 2018-07-12 DIAGNOSIS — M25562 Pain in left knee: Secondary | ICD-10-CM | POA: Diagnosis not present

## 2018-07-12 DIAGNOSIS — M25662 Stiffness of left knee, not elsewhere classified: Secondary | ICD-10-CM | POA: Diagnosis not present

## 2018-07-12 DIAGNOSIS — Z96652 Presence of left artificial knee joint: Secondary | ICD-10-CM | POA: Diagnosis not present

## 2018-07-19 DIAGNOSIS — L723 Sebaceous cyst: Secondary | ICD-10-CM | POA: Diagnosis not present

## 2018-07-19 DIAGNOSIS — I10 Essential (primary) hypertension: Secondary | ICD-10-CM | POA: Diagnosis not present

## 2018-07-19 DIAGNOSIS — N182 Chronic kidney disease, stage 2 (mild): Secondary | ICD-10-CM | POA: Diagnosis not present

## 2018-07-19 DIAGNOSIS — F330 Major depressive disorder, recurrent, mild: Secondary | ICD-10-CM | POA: Diagnosis not present

## 2018-07-21 DIAGNOSIS — Z96652 Presence of left artificial knee joint: Secondary | ICD-10-CM | POA: Diagnosis not present

## 2018-07-21 DIAGNOSIS — M25662 Stiffness of left knee, not elsewhere classified: Secondary | ICD-10-CM | POA: Diagnosis not present

## 2018-07-21 DIAGNOSIS — R531 Weakness: Secondary | ICD-10-CM | POA: Diagnosis not present

## 2018-07-21 DIAGNOSIS — M25562 Pain in left knee: Secondary | ICD-10-CM | POA: Diagnosis not present

## 2018-08-01 IMAGING — US US EXTREM LOW VENOUS*R*
1 series · 13 of 24 positions shown · non-contrast
Comparison: None.

CLINICAL DATA: Right lower extremity swelling and pain



[Series 1: us extrem low venous*right* · 0.09mm/px · 13 of 36 slices shown]
[im 1/36]
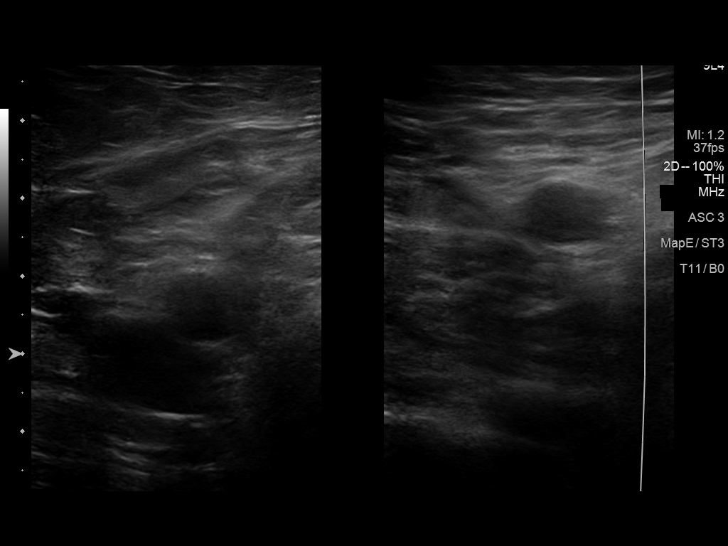
[im 4/36]
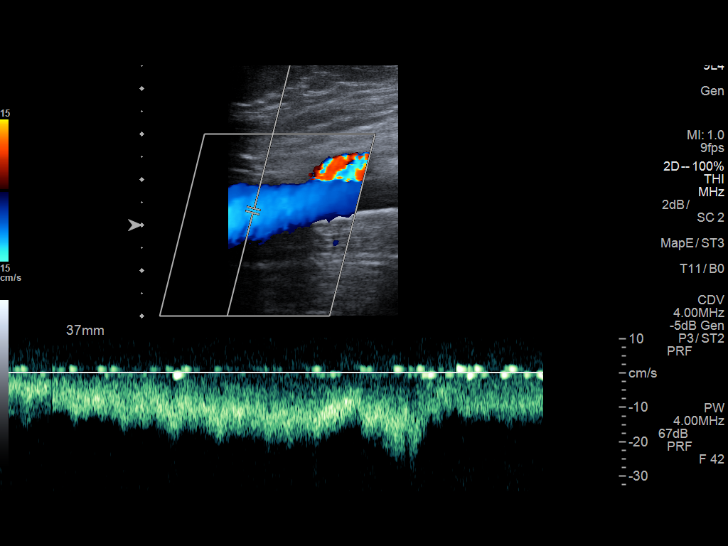
[im 7/36]
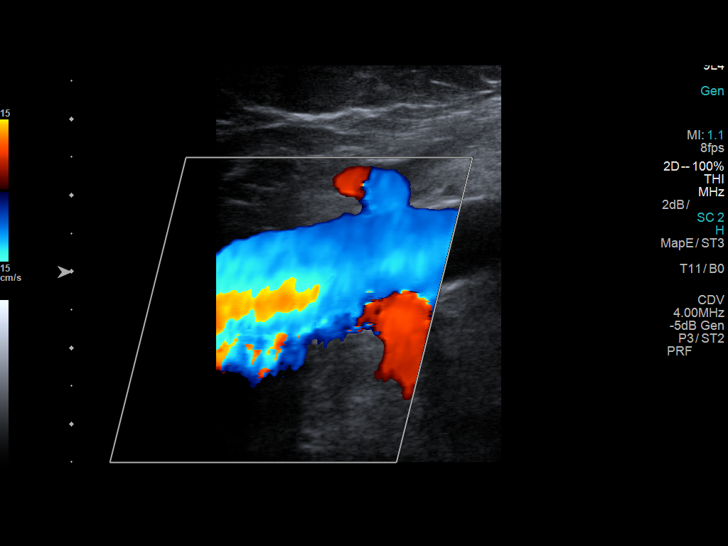
[im 10/36]
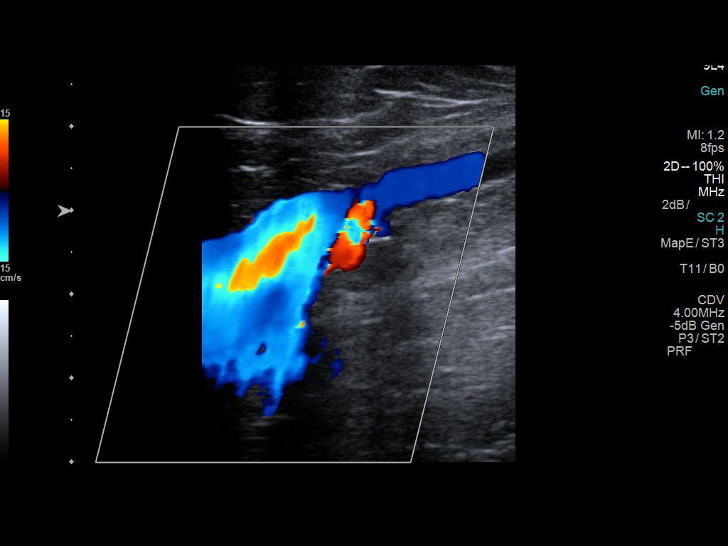
[im 13/36]
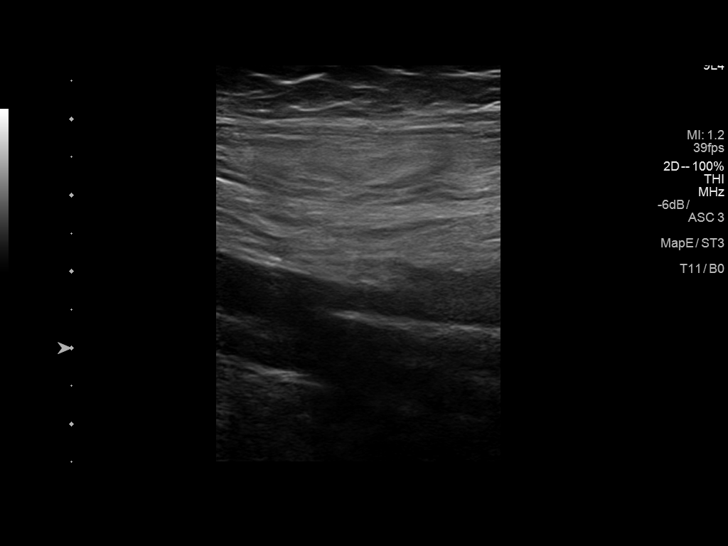
[im 16/36]
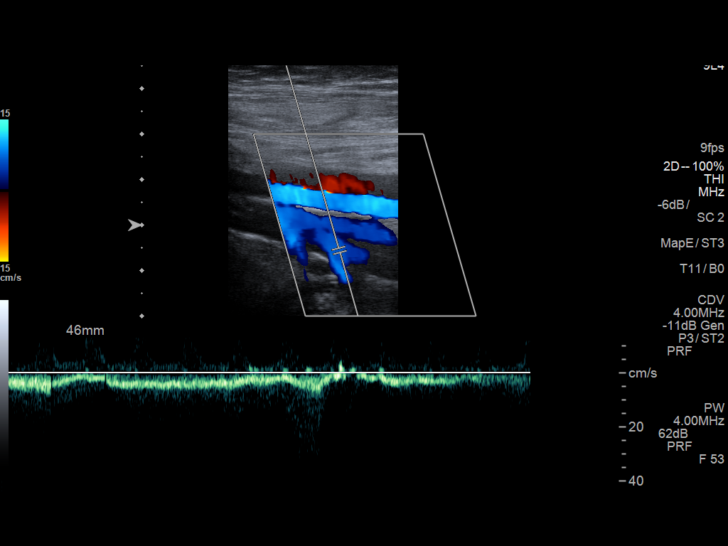
[im 19/36]
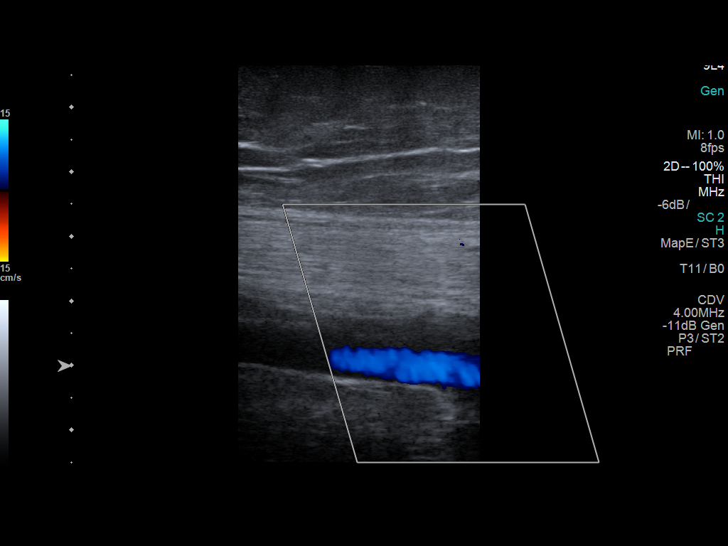
[im 20/36]
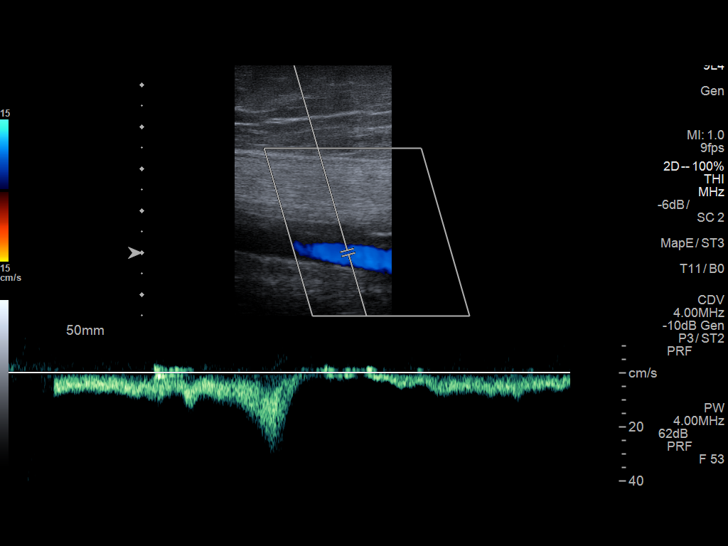
[im 23/36]
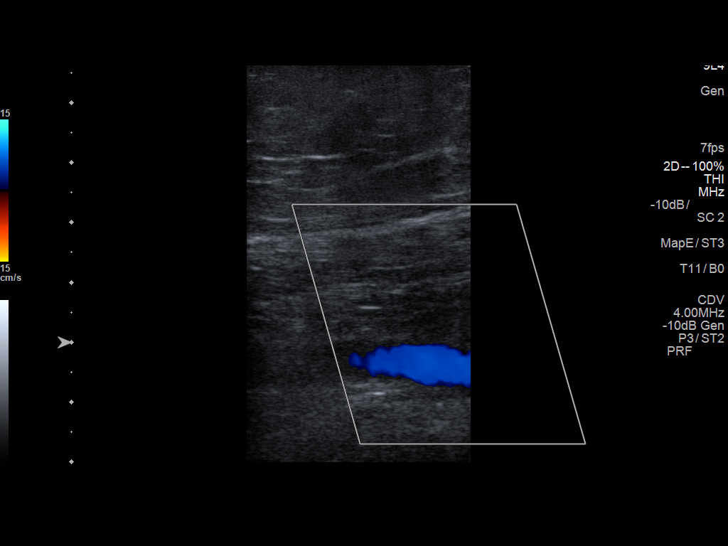
[im 26/36]
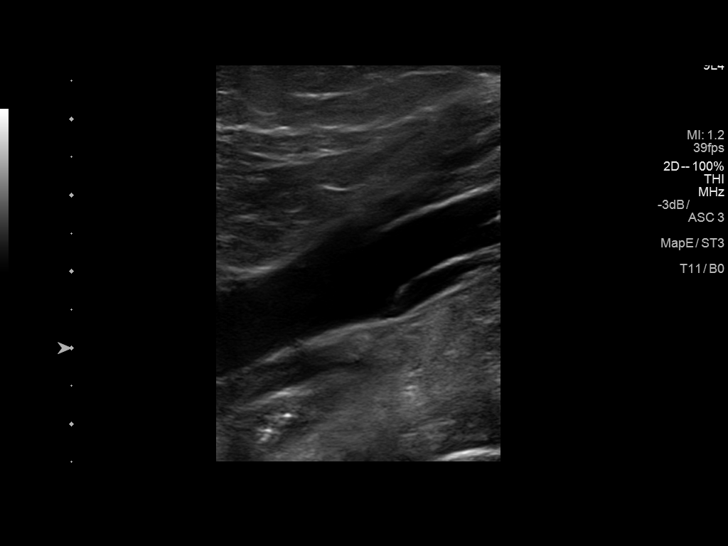
[im 29/36]
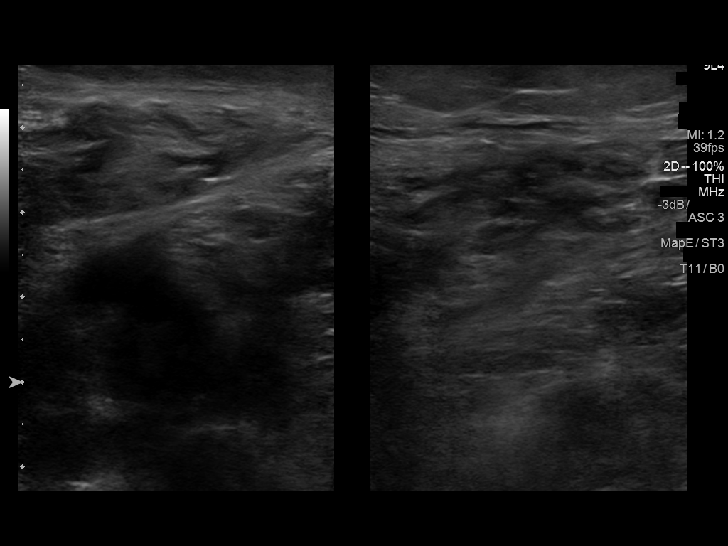
[im 32/36]
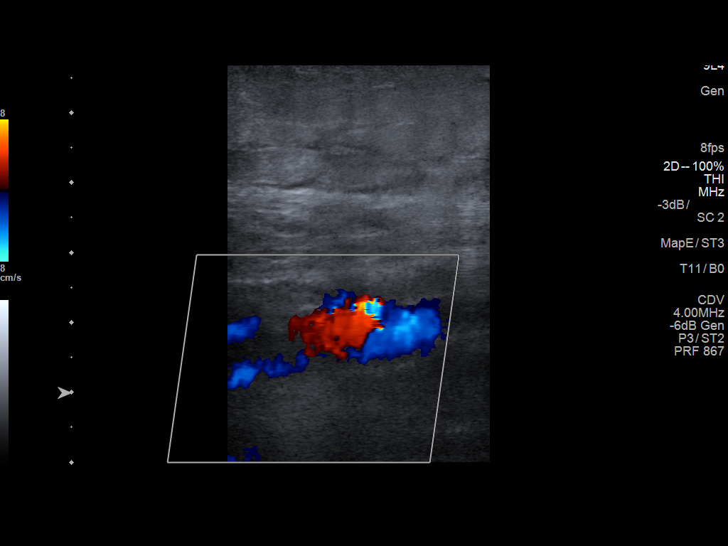
[im 36/36]
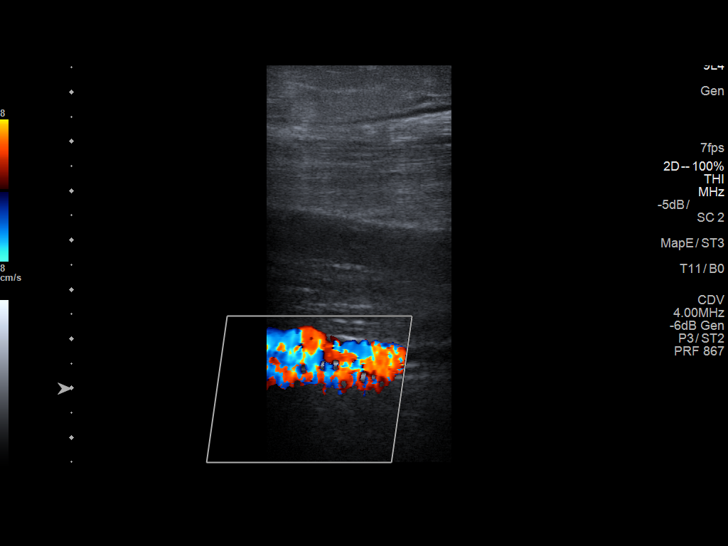

[13 of 24 positions shown; findings below may reference images not displayed]

FINDINGS: Contralateral Common Femoral Vein: Respiratory phasicity is normal
and symmetric with the symptomatic side. No evidence of thrombus.
Normal compressibility.

Common Femoral Vein: No evidence of thrombus. Normal
compressibility, respiratory phasicity and response to augmentation.

Saphenofemoral Junction: No evidence of thrombus. Normal
compressibility and flow on color Doppler imaging.

Profunda Femoral Vein: No evidence of thrombus. Normal
compressibility and flow on color Doppler imaging.

Femoral Vein: No evidence of thrombus. Normal compressibility,
respiratory phasicity and response to augmentation.

Popliteal Vein: No evidence of thrombus. Normal compressibility,
respiratory phasicity and response to augmentation.

Calf Veins: No evidence of thrombus. Normal compressibility and flow
on color Doppler imaging.

Superficial Great Saphenous Vein: No evidence of thrombus. Normal
compressibility.

Venous Reflux:  None.

Other Findings:  Right lower extremity subcutaneous edema noted.
IMPRESSION: Negative for right lower extremity DVT.

## 2018-08-02 DIAGNOSIS — G8929 Other chronic pain: Secondary | ICD-10-CM | POA: Diagnosis not present

## 2018-09-29 DIAGNOSIS — M545 Low back pain: Secondary | ICD-10-CM | POA: Diagnosis not present

## 2018-09-29 DIAGNOSIS — G894 Chronic pain syndrome: Secondary | ICD-10-CM | POA: Diagnosis not present

## 2018-10-25 DIAGNOSIS — I10 Essential (primary) hypertension: Secondary | ICD-10-CM | POA: Diagnosis not present

## 2018-10-25 DIAGNOSIS — Z6838 Body mass index (BMI) 38.0-38.9, adult: Secondary | ICD-10-CM | POA: Diagnosis not present

## 2018-10-25 DIAGNOSIS — L723 Sebaceous cyst: Secondary | ICD-10-CM | POA: Diagnosis not present

## 2018-10-25 DIAGNOSIS — G43111 Migraine with aura, intractable, with status migrainosus: Secondary | ICD-10-CM | POA: Diagnosis not present

## 2018-10-25 DIAGNOSIS — Z0000 Encounter for general adult medical examination without abnormal findings: Secondary | ICD-10-CM | POA: Diagnosis not present

## 2018-10-25 DIAGNOSIS — Z6835 Body mass index (BMI) 35.0-35.9, adult: Secondary | ICD-10-CM | POA: Diagnosis not present

## 2018-10-25 DIAGNOSIS — N182 Chronic kidney disease, stage 2 (mild): Secondary | ICD-10-CM | POA: Diagnosis not present

## 2018-11-17 DIAGNOSIS — M25561 Pain in right knee: Secondary | ICD-10-CM | POA: Diagnosis not present

## 2018-11-17 DIAGNOSIS — Z96653 Presence of artificial knee joint, bilateral: Secondary | ICD-10-CM | POA: Diagnosis not present

## 2018-11-17 DIAGNOSIS — Z471 Aftercare following joint replacement surgery: Secondary | ICD-10-CM | POA: Diagnosis not present

## 2018-11-20 DIAGNOSIS — Z1231 Encounter for screening mammogram for malignant neoplasm of breast: Secondary | ICD-10-CM | POA: Diagnosis not present

## 2019-12-03 ENCOUNTER — Encounter: Payer: Self-pay | Admitting: Obstetrics and Gynecology

## 2019-12-03 ENCOUNTER — Other Ambulatory Visit: Payer: Self-pay

## 2019-12-03 ENCOUNTER — Ambulatory Visit (INDEPENDENT_AMBULATORY_CARE_PROVIDER_SITE_OTHER): Payer: Managed Care, Other (non HMO) | Admitting: Obstetrics and Gynecology

## 2019-12-03 DIAGNOSIS — Z6841 Body Mass Index (BMI) 40.0 and over, adult: Secondary | ICD-10-CM | POA: Diagnosis not present

## 2019-12-03 MED ORDER — PHENTERMINE HCL 37.5 MG PO TABS: 37.5000 mg | ORAL_TABLET | Freq: Every day | ORAL | 0 refills | Status: DC

## 2019-12-03 NOTE — Progress Notes (Signed)
Gynecology Office Visit  Chief Complaint:  Chief Complaint  Patient presents with  . Weight Check  . Consult    History of Present Illness: Patientis a 54 y.o. G2P2 female, who presents for the evaluation of weight gain. She has gained 40 pounds primarily over 12 months. The patient states the following issues have contributed to her weight problem: decreased physical activity bilateral knee replacements, covid pandemic.  The patient has no additional symptoms. The patient specifically denies memory loss, muscle weakness, excessive thirst, and polyuria. Weight related co-morbidities include HTN. She has tried phentermine interventions in the past with good success and 37lbs weight loss.   Review of Systems: 10 point review of systems negative unless otherwise noted in HPI  Past Medical History:  Past Medical History:  Diagnosis Date  . Anxiety   . Chronic constipation   . Chronic low back pain    mobic---- numbnes down right leg from hip occasionally  . Depression   . Family history of adverse reaction to anesthesia    mother-- ponv and sister PONV  . GERD (gastroesophageal reflux disease)   . Hiatal hernia   . Hypertension   . Osteoarthritis of knee    right and left  . Seasonal allergies     Past Surgical History:  Past Surgical History:  Procedure Laterality Date  . COLONOSCOPY    . DILITATION & CURRETTAGE/HYSTROSCOPY WITH NOVASURE ABLATION N/A 06/15/2013   Procedure: DILATATION & CURETTAGE/HYSTEROSCOPY WITH NOVASURE ABLATION;  Surgeon: Lenoard Aden, MD;  Location: [facility];  Service: Gynecology;  Laterality: N/A;  . ESOPHAGOGASTRODUODENOSCOPY    . JOINT REPLACEMENT     left total knee Dr. Charlann Boxer 05-11-18  . LAPAROSCOPIC NISSEN FUNDOPLICATION  2007  . TOE SURGERY Bilateral 1996   bilateral 5th toe bones removed  . TOTAL KNEE ARTHROPLASTY Right 02/27/2018   Procedure: RIGHT TOTAL KNEE ARTHROPLASTY;  Surgeon: Durene Romans, MD;  Location: [facility];  Service: Orthopedics;   Laterality: Right;  90 mins  . TOTAL KNEE ARTHROPLASTY Left 05/11/2018   Procedure: LEFT TOTAL KNEE ARTHROPLASTY;  Surgeon: Durene Romans, MD;  Location: [facility];  Service: Orthopedics;  Laterality: Left;  70 mins  . TUBAL LIGATION Bilateral 1989    Gynecologic History: No LMP recorded. Patient has had an ablation.  Obstetric History: G2P2  Family History:  History reviewed. No pertinent family history.  Social History:  Social History   Socioeconomic History  . Marital status: Divorced    Spouse name: Not on file  . Number of children: Not on file  . Years of education: Not on file  . Highest education level: Not on file  Occupational History  . Not on file  Tobacco Use  . Smoking status: Never Smoker  . Smokeless tobacco: Never Used  Substance and Sexual Activity  . Alcohol use: Yes    Comment: rare  . Drug use: No  . Sexual activity: Yes    Partners: Male    Birth control/protection: Surgical  Other Topics Concern  . Not on file  Social History Narrative  . Not on file   Social Determinants of Health   Financial Resource Strain:   . Difficulty of Paying Living Expenses: Not on file  Food Insecurity:   . Worried About Programme researcher, broadcasting/film/video in the Last Year: Not on file  . Ran Out of Food in the Last Year: Not on file  Transportation Needs:   . Lack of Transportation (Medical): Not on file  .  Lack of Transportation (Non-Medical): Not on file  Physical Activity:   . Days of Exercise per Week: Not on file  . Minutes of Exercise per Session: Not on file  Stress:   . Feeling of Stress : Not on file  Social Connections:   . Frequency of Communication with Friends and Family: Not on file  . Frequency of Social Gatherings with Friends and Family: Not on file  . Attends Religious Services: Not on file  . Active Member of Clubs or Organizations: Not on file  . Attends Banker Meetings: Not on file  . Marital Status: Not on file  Intimate Partner Violence:    . Fear of Current or Ex-Partner: Not on file  . Emotionally Abused: Not on file  . Physically Abused: Not on file  . Sexually Abused: Not on file    Allergies:  Allergies  Allergen Reactions  . Elavil [Amitriptyline] Other (See Comments)    Urine retention  . Topamax [Topiramate] Other (See Comments)    Numbness in fingertips    Medications: Prior to Admission medications   Medication Sig Start Date End Date Taking? Authorizing Provider  Multiple Vitamins-Minerals (HAIR SKIN AND NAILS FORMULA) TABS Take 1 tablet by mouth daily.   Yes [provider]  pantoprazole (PROTONIX) 40 MG tablet Take 1 tablet (40 mg total) by mouth every morning. 05/13/18  Yes Babish, Molli Hazard, PA-C  promethazine (PHENERGAN) 25 MG tablet Take 25 mg by mouth every 6 (six) hours as needed for nausea or vomiting.  06/16/17  Yes [provider]  triamterene-hydrochlorothiazide (MAXZIDE) 75-50 MG tablet Take 1 tablet by mouth every morning.  10/19/17  Yes [provider]  acetaminophen (TYLENOL) 500 MG tablet Take 2 tablets (1,000 mg total) by mouth every 8 (eight) hours. 05/12/18   Lanney Gins, PA-C  ALPRAZolam Prudy Feeler) 1 MG tablet Take 1 mg by mouth 2 (two) times daily as needed for anxiety.  06/16/17   [provider]  cyclobenzaprine (FLEXERIL) 10 MG tablet Take 1 tablet (10 mg total) by mouth 3 (three) times daily as needed for muscle spasms. Patient not taking: Reported on 12/03/2019 05/12/18   Lanney Gins, PA-C  diphenhydrAMINE HCl, Sleep, (ZZZQUIL) 25 MG CAPS Take 25-50 mg by mouth at bedtime as needed (sleep).    [provider]  docusate sodium (COLACE) 100 MG capsule Take 1 capsule (100 mg total) by mouth 2 (two) times daily. Patient not taking: Reported on 12/03/2019 05/12/18   Lanney Gins, PA-C  ferrous sulfate (FERROUSUL) 325 (65 FE) MG tablet Take 1 tablet (325 mg total) by mouth 3 (three) times daily with meals. Patient not taking: Reported on 12/03/2019  05/12/18   Lanney Gins, PA-C  FLUoxetine (PROZAC) 40 MG capsule Take 40 mg by mouth every morning.  05/31/17   [provider]  lidocaine (LIDODERM) 5 % Place 1 patch onto the skin daily as needed for pain. 04/20/18   [provider]  LYSINE PO Take 500 mg by mouth daily.    [provider]  polyethylene glycol (MIRALAX / GLYCOLAX) packet Take 17 g by mouth 2 (two) times daily. Patient not taking: Reported on 12/03/2019 05/12/18   Lanney Gins, PA-C    Physical Exam Blood pressure (!) 150/90, height 5\' 5"  (1.651 m), weight 286 lb (129.7 kg). No LMP recorded. Patient has had an ablation.  General: NAD HEENT: normocephalic, anicteric Thyroid: no enlargement Pulmonary: no increased work of breathing Neurologic: Grossly intact Psychiatric: mood appropriate, affect full  Assessment: 54 y.o. G2P2 presenting for discussion of weight loss management options  Plan: Problem List Items Addressed This Visit    None    Visit Diagnoses    Class 3 severe obesity with serious comorbidity and body mass index (BMI) of 45.0 to 49.9 in adult, unspecified obesity type ([place])    -  Primary      1) 1500 Calorie ADA Diet  2) Patient education given regarding appropriate lifestyle changes for weight loss including: regular physical activity, healthy coping strategies, caloric restriction and healthy eating patterns.  3) Patient will be started on weight loss medication. The risks and benefits and side effects of medication, such as Adipex (Phenteramine) ,  Tenuate (Diethylproprion), Belviq (lorcarsin), Contrave (buproprion/naltrexone), Qsymia (phentermine/topiramate), and Saxenda (liraglutide) is discussed. The pros and cons of suppressing appetite and boosting metabolism is discussed. Risks of tolerence and addiction is discussed for selected agents discussed. Use of medicine will ne short term, such as 3-4 months at a time followed by a period of time off of the medicine to avoid  these risks and side effects for Adipex, Qsymia, and Tenuate discussed. Pt to call with any negative side effects and agrees to keep follow up appts.  4) HTN - has previously tolerated phentermine.  Has ability to home monitor BP.  Discussed if BP worsens would need to discontinue.    5) Encouraged weekly weight monitorig to track progress and sample 1 week food diary  6) 15 minutes face-to-face; counseling/coordination of care > 50 percent of visit  7) Return in about 1 week (around 12/10/2019) for BP check in 1 week, 4 week in person medication follow up.   Malachy Mood, MD, [facility], [facility] 12/03/2019, 9:42 AM

## 2019-12-11 ENCOUNTER — Ambulatory Visit: Payer: Managed Care, Other (non HMO) | Admitting: Obstetrics and Gynecology

## 2019-12-31 ENCOUNTER — Other Ambulatory Visit: Payer: Self-pay

## 2019-12-31 ENCOUNTER — Encounter: Payer: Self-pay | Admitting: Obstetrics and Gynecology

## 2019-12-31 ENCOUNTER — Ambulatory Visit (INDEPENDENT_AMBULATORY_CARE_PROVIDER_SITE_OTHER): Payer: Managed Care, Other (non HMO) | Admitting: Obstetrics and Gynecology

## 2019-12-31 VITALS — Ht 65.0 in | Wt 276.0 lb

## 2019-12-31 DIAGNOSIS — Z6841 Body Mass Index (BMI) 40.0 and over, adult: Secondary | ICD-10-CM

## 2019-12-31 MED ORDER — PHENTERMINE HCL 37.5 MG PO TABS: 37.5000 mg | ORAL_TABLET | Freq: Every day | ORAL | 0 refills | Status: DC

## 2019-12-31 NOTE — Progress Notes (Signed)
I connected with Amanda Taylor on 12/31/19 at  8:50 AM EST by telephone and verified that I am speaking with the correct person using two identifiers.   I discussed the limitations, risks, security and privacy concerns of performing an evaluation and management service by telephone and the availability of in person appointments. I also discussed with the patient that there may be a patient responsible charge related to this service. The patient expressed understanding and agreed to proceed.  The patient was at home. I spoke with the patient from my workstation phone. The names of people involved in this encounter were: Amanda Taylor , and Malachy Mood  Gynecology Office Visit  Chief Complaint:  Chief Complaint  Patient presents with  . Weight Check    History of Present Illness: Patientis a 54 y.o. G2P2 female, who presents for the evaluation of the desire to lose weight. She has lost 10 pounds 1 months. The patient states the following symptoms since starting her weight loss therapy: appetite suppression, energy, and weight loss.  The patient also reports no other ill effects. The patient specifically denies heart palpitations, anxiety, and insomnia.    Review of Systems: 10 point review of systems negative unless otherwise noted in HPI  Past Medical History:  Past Medical History:  Diagnosis Date  . Anxiety   . Chronic constipation   . Chronic low back pain    mobic---- numbnes down right leg from hip occasionally  . Depression   . Family history of adverse reaction to anesthesia    mother-- ponv and sister PONV  . GERD (gastroesophageal reflux disease)   . Hiatal hernia   . Hypertension   . Osteoarthritis of knee    right and left  . Seasonal allergies     Past Surgical History:  Past Surgical History:  Procedure Laterality Date  . COLONOSCOPY    . DILITATION & CURRETTAGE/HYSTROSCOPY WITH NOVASURE ABLATION N/A 06/15/2013   Procedure: DILATATION &  CURETTAGE/HYSTEROSCOPY WITH NOVASURE ABLATION;  Surgeon: Lovenia Kim, MD;  Location: [facility];  Service: Gynecology;  Laterality: N/A;  . ESOPHAGOGASTRODUODENOSCOPY    . JOINT REPLACEMENT     left total knee Dr. Alvan Dame 05-11-18  . LAPAROSCOPIC NISSEN FUNDOPLICATION  4010  . TOE SURGERY Bilateral 1996   bilateral 5th toe bones removed  . TOTAL KNEE ARTHROPLASTY Right 02/27/2018   Procedure: RIGHT TOTAL KNEE ARTHROPLASTY;  Surgeon: Paralee Cancel, MD;  Location: [facility];  Service: Orthopedics;  Laterality: Right;  90 mins  . TOTAL KNEE ARTHROPLASTY Left 05/11/2018   Procedure: LEFT TOTAL KNEE ARTHROPLASTY;  Surgeon: Paralee Cancel, MD;  Location: [facility];  Service: Orthopedics;  Laterality: Left;  70 mins  . TUBAL LIGATION Bilateral 1989    Gynecologic History: No LMP recorded. Patient has had an ablation.  Obstetric History: G2P2  Family History:  History reviewed. No pertinent family history.  Social History:  Social History   Socioeconomic History  . Marital status: Divorced    Spouse name: Not on file  . Number of children: Not on file  . Years of education: Not on file  . Highest education level: Not on file  Occupational History  . Not on file  Tobacco Use  . Smoking status: Never Smoker  . Smokeless tobacco: Never Used  Substance and Sexual Activity  . Alcohol use: Yes    Comment: rare  . Drug use: No  . Sexual activity: Yes    Partners: Male  Birth control/protection: Surgical  Other Topics Concern  . Not on file  Social History Narrative  . Not on file   Social Determinants of Health   Financial Resource Strain:   . Difficulty of Paying Living Expenses: Not on file  Food Insecurity:   . Worried About Programme researcher, broadcasting/film/video in the Last Year: Not on file  . Ran Out of Food in the Last Year: Not on file  Transportation Needs:   . Lack of Transportation (Medical): Not on file  . Lack of Transportation (Non-Medical): Not on file  Physical Activity:   . Days of  Exercise per Week: Not on file  . Minutes of Exercise per Session: Not on file  Stress:   . Feeling of Stress : Not on file  Social Connections:   . Frequency of Communication with Friends and Family: Not on file  . Frequency of Social Gatherings with Friends and Family: Not on file  . Attends Religious Services: Not on file  . Active Member of Clubs or Organizations: Not on file  . Attends Banker Meetings: Not on file  . Marital Status: Not on file  Intimate Partner Violence:   . Fear of Current or Ex-Partner: Not on file  . Emotionally Abused: Not on file  . Physically Abused: Not on file  . Sexually Abused: Not on file    Allergies:  Allergies  Allergen Reactions  . Elavil [Amitriptyline] Other (See Comments)    Urine retention  . Topamax [Topiramate] Other (See Comments)    Numbness in fingertips    Medications: Prior to Admission medications   Medication Sig Start Date End Date Taking? Authorizing Provider  acetaminophen (TYLENOL) 500 MG tablet Take 2 tablets (1,000 mg total) by mouth every 8 (eight) hours. 05/12/18  Yes Babish, Molli Hazard, PA-C  ALPRAZolam Prudy Feeler) 1 MG tablet Take 1 mg by mouth 2 (two) times daily as needed for anxiety.  06/16/17  Yes [provider]  diphenhydrAMINE HCl, Sleep, (ZZZQUIL) 25 MG CAPS Take 25-50 mg by mouth at bedtime as needed (sleep).   Yes [provider]  FLUoxetine (PROZAC) 40 MG capsule Take 40 mg by mouth every morning.  05/31/17  Yes [provider]  lidocaine (LIDODERM) 5 % Place 1 patch onto the skin daily as needed for pain. 04/20/18  Yes [provider]  LYSINE PO Take 500 mg by mouth daily.   Yes [provider]  Multiple Vitamins-Minerals (HAIR SKIN AND NAILS FORMULA) TABS Take 1 tablet by mouth daily.   Yes [provider]  pantoprazole (PROTONIX) 40 MG tablet Take 1 tablet (40 mg total) by mouth every morning. 05/13/18  Yes Babish, Molli Hazard, PA-C  phentermine  (ADIPEX-P) 37.5 MG tablet Take 1 tablet (37.5 mg total) by mouth daily before breakfast. 12/03/19  Yes Vena Austria, MD  promethazine (PHENERGAN) 25 MG tablet Take 25 mg by mouth every 6 (six) hours as needed for nausea or vomiting.  06/16/17  Yes [provider]  triamterene-hydrochlorothiazide (MAXZIDE) 75-50 MG tablet Take 1 tablet by mouth every morning.  10/19/17  Yes [provider]    Physical Exam Height 5\' 5"  (1.651 m), weight 276 lb (125.2 kg). Wt Readings from Last 3 Encounters:  12/31/19 276 lb (125.2 kg)  12/03/19 286 lb (129.7 kg)  05/11/18 227 lb (103 kg)  Body mass index is 45.93 kg/m.  No physical exam as this was a remote telephone visit to promote social distancing during the current COVID-19 Pandemic  Assessment: 54 y.o. G2P2 medical weight loss follow up Plan: Problem List Items Addressed This Visit    None    Visit Diagnoses    Class 3 severe obesity with serious comorbidity and body mass index (BMI) of 45.0 to 49.9 in adult, unspecified obesity type (HCC)    -  Primary   Relevant Medications   phentermine (ADIPEX-P) 37.5 MG tablet      1) 1500 Calorie ADA Diet  2) Patient education given regarding appropriate lifestyle changes for weight loss including: regular physical activity, healthy coping strategies, caloric restriction and healthy eating patterns.  3) Patient to take medication, with the benefits of appetite suppression and metabolism boost d/w pt, along with the side effects and risk factors of long term use that will be avoided with our use of short bursts of therapy. Rx provided.    4) Telephone time 3:22 minutes  5)  Return in about 4 weeks (around 01/28/2020) for medication follow up phone.    Vena Austria, MD, [facility], [facility] 12/31/2019, 9:21 AM

## 2020-01-28 ENCOUNTER — Other Ambulatory Visit: Payer: Self-pay

## 2020-01-28 ENCOUNTER — Encounter: Payer: Self-pay | Admitting: Obstetrics and Gynecology

## 2020-01-28 ENCOUNTER — Ambulatory Visit (INDEPENDENT_AMBULATORY_CARE_PROVIDER_SITE_OTHER): Payer: Managed Care, Other (non HMO) | Admitting: Obstetrics and Gynecology

## 2020-01-28 VITALS — Ht 65.0 in | Wt 268.0 lb

## 2020-01-28 DIAGNOSIS — Z713 Dietary counseling and surveillance: Secondary | ICD-10-CM

## 2020-01-28 DIAGNOSIS — Z6841 Body Mass Index (BMI) 40.0 and over, adult: Secondary | ICD-10-CM | POA: Diagnosis not present

## 2020-01-28 MED ORDER — PHENTERMINE HCL 37.5 MG PO TABS: 37.5000 mg | ORAL_TABLET | Freq: Every day | ORAL | 0 refills | Status: AC

## 2020-01-28 NOTE — Progress Notes (Signed)
I connected with Amanda Taylor on 01/28/20 at  8:30 AM EST by telephone and verified that I am speaking with the correct person using two identifiers.   I discussed the limitations, risks, security and privacy concerns of performing an evaluation and management service by telephone and the availability of in person appointments. I also discussed with the patient that there may be a patient responsible charge related to this service. The patient expressed understanding and agreed to proceed.  The patient was at home I spoke with the patient from my workstation phone The names of people involved in this encounter were: Amanda Taylor , and Malachy Mood   Gynecology Office Visit  Chief Complaint:  Chief Complaint  Patient presents with  . Weight Check    History of Present Illness: Patientis a 54 y.o. G2P2 female, who presents for the evaluation of the desire to lose weight. She has lost 8 pounds 1 months. 18lbs total in past 2 months. The patient states the following symptoms since starting her weight loss therapy: appetite suppression, energy, and weight loss.  The patient also reports no other ill effects. The patient specifically denies heart palpitations, anxiety, and insomnia.    Review of Systems: 10 point review of systems negative unless otherwise noted in HPI  Past Medical History:  Past Medical History:  Diagnosis Date  . Anxiety   . Chronic constipation   . Chronic low back pain    mobic---- numbnes down right leg from hip occasionally  . Depression   . Family history of adverse reaction to anesthesia    mother-- ponv and sister PONV  . GERD (gastroesophageal reflux disease)   . Hiatal hernia   . Hypertension   . Osteoarthritis of knee    right and left  . Seasonal allergies     Past Surgical History:  Past Surgical History:  Procedure Laterality Date  . COLONOSCOPY    . DILITATION & CURRETTAGE/HYSTROSCOPY WITH NOVASURE ABLATION N/A 06/15/2013   Procedure: DILATATION & CURETTAGE/HYSTEROSCOPY WITH NOVASURE ABLATION;  Surgeon: Lovenia Kim, MD;  Location: [facility];  Service: Gynecology;  Laterality: N/A;  . ESOPHAGOGASTRODUODENOSCOPY    . JOINT REPLACEMENT     left total knee Dr. Alvan Dame 05-11-18  . LAPAROSCOPIC NISSEN FUNDOPLICATION  1749  . TOE SURGERY Bilateral 1996   bilateral 5th toe bones removed  . TOTAL KNEE ARTHROPLASTY Right 02/27/2018   Procedure: RIGHT TOTAL KNEE ARTHROPLASTY;  Surgeon: Paralee Cancel, MD;  Location: [facility];  Service: Orthopedics;  Laterality: Right;  90 mins  . TOTAL KNEE ARTHROPLASTY Left 05/11/2018   Procedure: LEFT TOTAL KNEE ARTHROPLASTY;  Surgeon: Paralee Cancel, MD;  Location: [facility];  Service: Orthopedics;  Laterality: Left;  70 mins  . TUBAL LIGATION Bilateral 1989    Gynecologic History: No LMP recorded. Patient has had an ablation.  Obstetric History: G2P2  Family History:  History reviewed. No pertinent family history.  Social History:  Social History   Socioeconomic History  . Marital status: Divorced    Spouse name: Not on file  . Number of children: Not on file  . Years of education: Not on file  . Highest education level: Not on file  Occupational History  . Not on file  Tobacco Use  . Smoking status: Never Smoker  . Smokeless tobacco: Never Used  Substance and Sexual Activity  . Alcohol use: Yes    Comment: rare  . Drug use: No  . Sexual activity: Yes  Partners: Male    Birth control/protection: Surgical  Other Topics Concern  . Not on file  Social History Narrative  . Not on file   Social Determinants of Health   Financial Resource Strain:   . Difficulty of Paying Living Expenses: Not on file  Food Insecurity:   . Worried About Programme researcher, broadcasting/film/video in the Last Year: Not on file  . Ran Out of Food in the Last Year: Not on file  Transportation Needs:   . Lack of Transportation (Medical): Not on file  . Lack of Transportation (Non-Medical): Not on file  Physical  Activity:   . Days of Exercise per Week: Not on file  . Minutes of Exercise per Session: Not on file  Stress:   . Feeling of Stress : Not on file  Social Connections:   . Frequency of Communication with Friends and Family: Not on file  . Frequency of Social Gatherings with Friends and Family: Not on file  . Attends Religious Services: Not on file  . Active Member of Clubs or Organizations: Not on file  . Attends Banker Meetings: Not on file  . Marital Status: Not on file  Intimate Partner Violence:   . Fear of Current or Ex-Partner: Not on file  . Emotionally Abused: Not on file  . Physically Abused: Not on file  . Sexually Abused: Not on file    Allergies:  Allergies  Allergen Reactions  . Elavil [Amitriptyline] Other (See Comments)    Urine retention  . Topamax [Topiramate] Other (See Comments)    Numbness in fingertips    Medications: Prior to Admission medications   Medication Sig Start Date End Date Taking? Authorizing Provider  acetaminophen (TYLENOL) 500 MG tablet Take 2 tablets (1,000 mg total) by mouth every 8 (eight) hours. 05/12/18  Yes Babish, Molli Hazard, PA-C  ALPRAZolam Prudy Feeler) 1 MG tablet Take 1 mg by mouth 2 (two) times daily as needed for anxiety.  06/16/17  Yes [provider]  diphenhydrAMINE HCl, Sleep, (ZZZQUIL) 25 MG CAPS Take 25-50 mg by mouth at bedtime as needed (sleep).   Yes [provider]  FLUoxetine (PROZAC) 40 MG capsule Take 40 mg by mouth every morning.  05/31/17  Yes [provider]  lidocaine (LIDODERM) 5 % Place 1 patch onto the skin daily as needed for pain. 04/20/18  Yes [provider]  LYSINE PO Take 500 mg by mouth daily.   Yes [provider]  Multiple Vitamins-Minerals (HAIR SKIN AND NAILS FORMULA) TABS Take 1 tablet by mouth daily.   Yes [provider]  pantoprazole (PROTONIX) 40 MG tablet Take 1 tablet (40 mg total) by mouth every morning. 05/13/18  Yes Babish, Molli Hazard, PA-C   phentermine (ADIPEX-P) 37.5 MG tablet Take 1 tablet (37.5 mg total) by mouth daily before breakfast. 12/31/19  Yes Vena Austria, MD  promethazine (PHENERGAN) 25 MG tablet Take 25 mg by mouth every 6 (six) hours as needed for nausea or vomiting.  06/16/17  Yes [provider]  triamterene-hydrochlorothiazide (MAXZIDE) 75-50 MG tablet Take 1 tablet by mouth every morning.  10/19/17  Yes [provider]    Physical Exam Height 5\' 5"  (1.651 m), weight 268 lb (121.6 kg). Wt Readings from Last 3 Encounters:  01/28/20 268 lb (121.6 kg)  12/31/19 276 lb (125.2 kg)  12/03/19 286 lb (129.7 kg)  Body mass index is 44.6 kg/m.  No physical exam as this was a remote telephone visit to promote social distancing  during the current COVID-19 Pandemic   Assessment: 54 y.o. G2P2 follow up medical   Plan: Problem List Items Addressed This Visit    None      1) 1500 Calorie ADA Diet  2) Patient education given regarding appropriate lifestyle changes for weight loss including: regular physical activity, healthy coping strategies, caloric restriction and healthy eating patterns.  3) Continue phentermine 37.5mg  with good weight loss in progress  4) Telephone time 5:18 minutes  5)  No follow-ups on file.    Vena Austria, MD, [facility], [facility] 01/28/2020, 8:39 AM

## 2020-02-25 ENCOUNTER — Other Ambulatory Visit: Payer: Self-pay

## 2020-02-25 ENCOUNTER — Telehealth: Payer: Managed Care, Other (non HMO) | Admitting: Obstetrics and Gynecology

## 2020-02-25 DIAGNOSIS — Z91199 Patient's noncompliance with other medical treatment and regimen due to unspecified reason: Secondary | ICD-10-CM

## 2020-02-25 DIAGNOSIS — Z5329 Procedure and treatment not carried out because of patient's decision for other reasons: Secondary | ICD-10-CM

## 2020-02-25 NOTE — Progress Notes (Signed)
Logged into 8:30 video visit appoint at 8:32.  Remained online until 8:45 before signing off.

## 2020-04-02 ENCOUNTER — Ambulatory Visit: Payer: Managed Care, Other (non HMO) | Admitting: Orthopaedic Surgery

## 2020-04-02 ENCOUNTER — Other Ambulatory Visit: Payer: Self-pay

## 2024-12-12 ENCOUNTER — Encounter (INDEPENDENT_AMBULATORY_CARE_PROVIDER_SITE_OTHER): Payer: Self-pay
